# Patient Record
Sex: Female | Born: 1935 | Race: White | Hispanic: No | Marital: Married | Smoking: Never smoker
Health system: Southern US, Community
[De-identification: ages and names within clinical notes are randomized; demographics above are authoritative.]

## PROBLEM LIST (undated history)

## (undated) DIAGNOSIS — Z853 Personal history of malignant neoplasm of breast: Secondary | ICD-10-CM

## (undated) DIAGNOSIS — N189 Chronic kidney disease, unspecified: Secondary | ICD-10-CM

## (undated) DIAGNOSIS — C801 Malignant (primary) neoplasm, unspecified: Secondary | ICD-10-CM

## (undated) DIAGNOSIS — G459 Transient cerebral ischemic attack, unspecified: Secondary | ICD-10-CM

## (undated) DIAGNOSIS — I10 Essential (primary) hypertension: Secondary | ICD-10-CM

## (undated) DIAGNOSIS — R011 Cardiac murmur, unspecified: Secondary | ICD-10-CM

## (undated) DIAGNOSIS — E119 Type 2 diabetes mellitus without complications: Secondary | ICD-10-CM

## (undated) DIAGNOSIS — C541 Malignant neoplasm of endometrium: Secondary | ICD-10-CM

## (undated) HISTORY — DX: Transient cerebral ischemic attack, unspecified: G45.9

## (undated) HISTORY — DX: Malignant (primary) neoplasm, unspecified: C80.1

## (undated) HISTORY — DX: Essential (primary) hypertension: I10

## (undated) HISTORY — DX: Personal history of malignant neoplasm of breast: Z85.3

## (undated) HISTORY — DX: Chronic kidney disease, unspecified: N18.9

## (undated) HISTORY — DX: Cardiac murmur, unspecified: R01.1

## (undated) HISTORY — DX: Type 2 diabetes mellitus without complications: E11.9

## (undated) HISTORY — DX: Malignant neoplasm of endometrium: C54.1

---

## 1968-09-30 HISTORY — PX: ABDOMINAL HYSTERECTOMY: SHX81

## 1998-01-30 HISTORY — PX: SQUAMOUS CELL CARCINOMA EXCISION: SHX2433

## 1998-02-10 ENCOUNTER — Other Ambulatory Visit: Admission: RE | Admit: 1998-02-10 | Discharge: 1998-02-10 | Payer: Self-pay | Admitting: Obstetrics and Gynecology

## 1999-01-31 HISTORY — PX: LAPAROSCOPIC CHOLECYSTECTOMY: SUR755

## 1999-04-05 ENCOUNTER — Other Ambulatory Visit: Admission: RE | Admit: 1999-04-05 | Discharge: 1999-04-05 | Payer: Self-pay | Admitting: Obstetrics and Gynecology

## 2001-06-03 ENCOUNTER — Other Ambulatory Visit: Admission: RE | Admit: 2001-06-03 | Discharge: 2001-06-03 | Payer: Self-pay | Admitting: Obstetrics and Gynecology

## 2006-01-30 HISTORY — PX: CATARACT EXTRACTION: SUR2

## 2006-08-01 ENCOUNTER — Encounter (INDEPENDENT_AMBULATORY_CARE_PROVIDER_SITE_OTHER): Payer: Self-pay | Admitting: Diagnostic Radiology

## 2006-08-01 ENCOUNTER — Encounter: Admission: RE | Admit: 2006-08-01 | Discharge: 2006-08-01 | Payer: Self-pay | Admitting: Obstetrics and Gynecology

## 2006-08-10 ENCOUNTER — Encounter: Admission: RE | Admit: 2006-08-10 | Discharge: 2006-08-10 | Payer: Self-pay | Admitting: Surgery

## 2006-09-05 ENCOUNTER — Ambulatory Visit (HOSPITAL_COMMUNITY): Admission: RE | Admit: 2006-09-05 | Discharge: 2006-09-06 | Payer: Self-pay | Admitting: Surgery

## 2006-09-05 ENCOUNTER — Encounter (INDEPENDENT_AMBULATORY_CARE_PROVIDER_SITE_OTHER): Payer: Self-pay | Admitting: Surgery

## 2006-09-05 HISTORY — PX: MASTECTOMY: SHX3

## 2007-01-04 ENCOUNTER — Ambulatory Visit: Payer: Self-pay | Admitting: Oncology

## 2007-03-04 ENCOUNTER — Ambulatory Visit: Payer: Self-pay | Admitting: Oncology

## 2007-11-07 ENCOUNTER — Ambulatory Visit: Payer: Self-pay | Admitting: Oncology

## 2008-07-04 IMAGING — CR DG CHEST 2V
2 series · 2 of 2 positions shown · non-contrast
Comparison: None.

CLINICAL DATA: Left breast cancer, preadmission for OR.
 CHEST - 2 VIEW:

[view not recorded (1 of 2)]
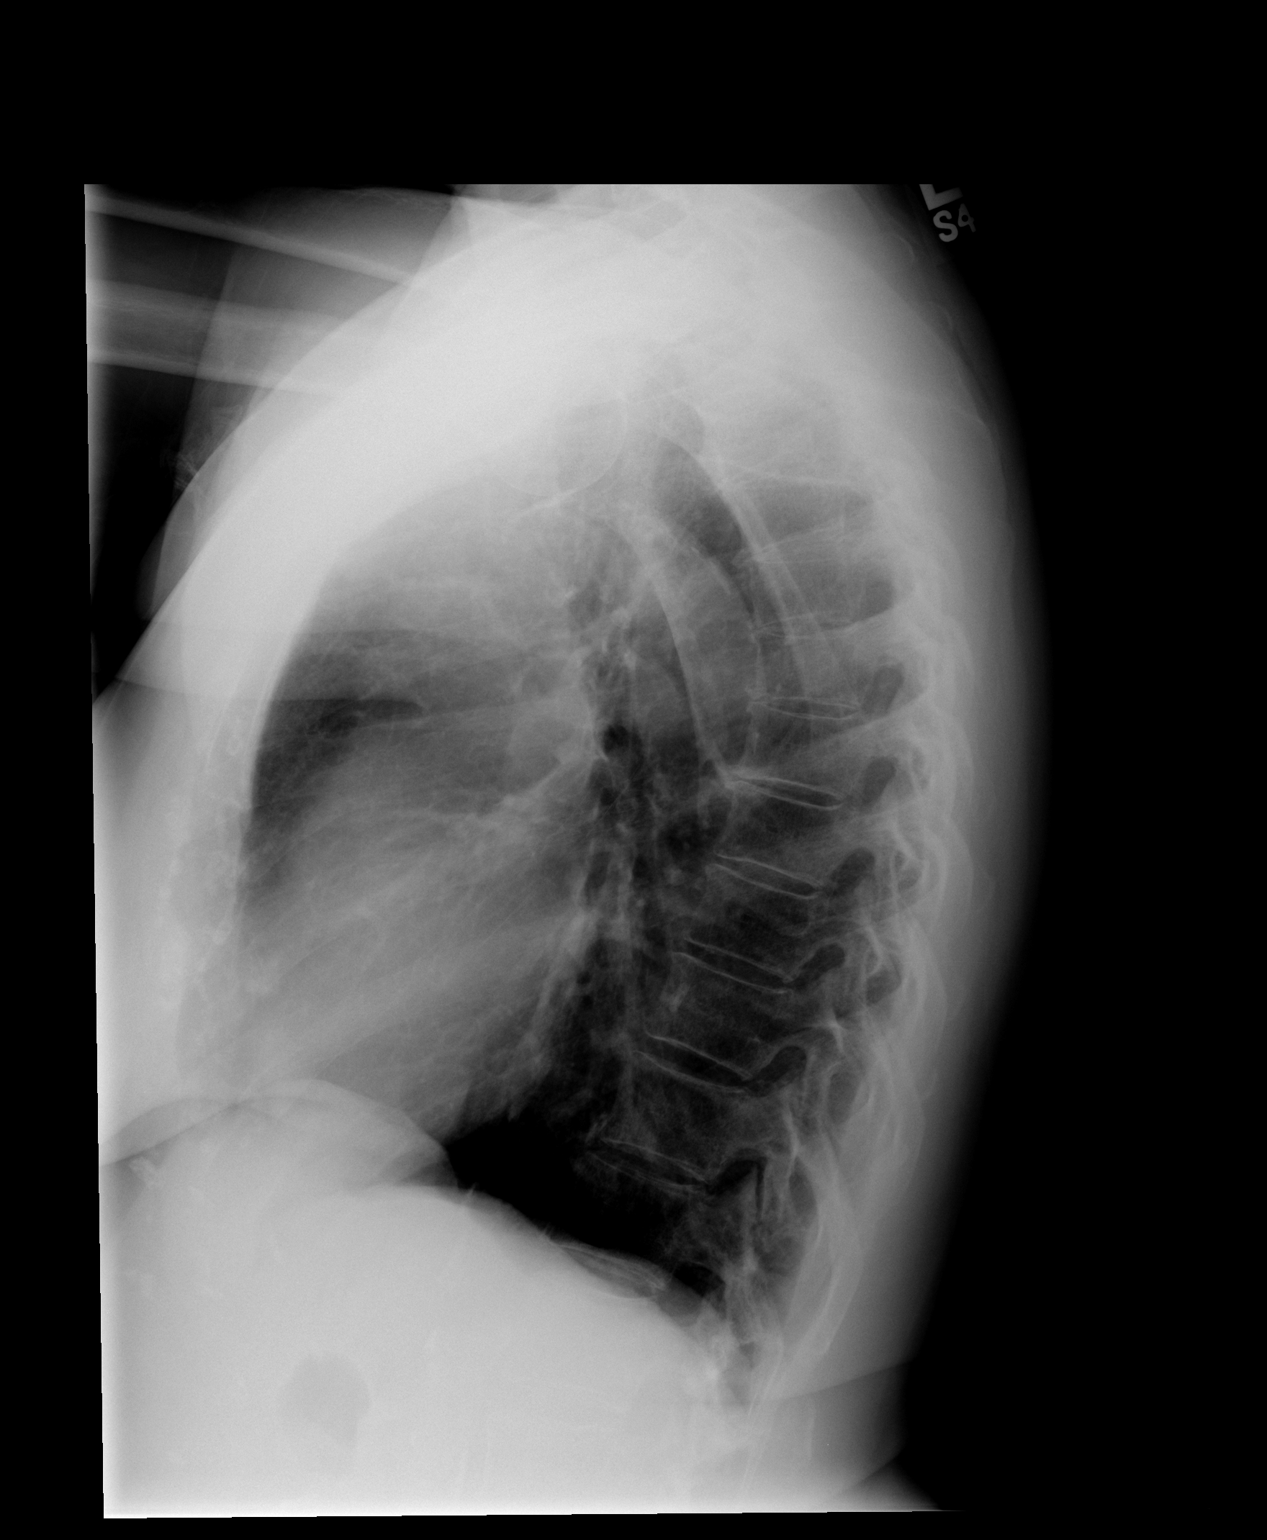

[view not recorded (2 of 2)]
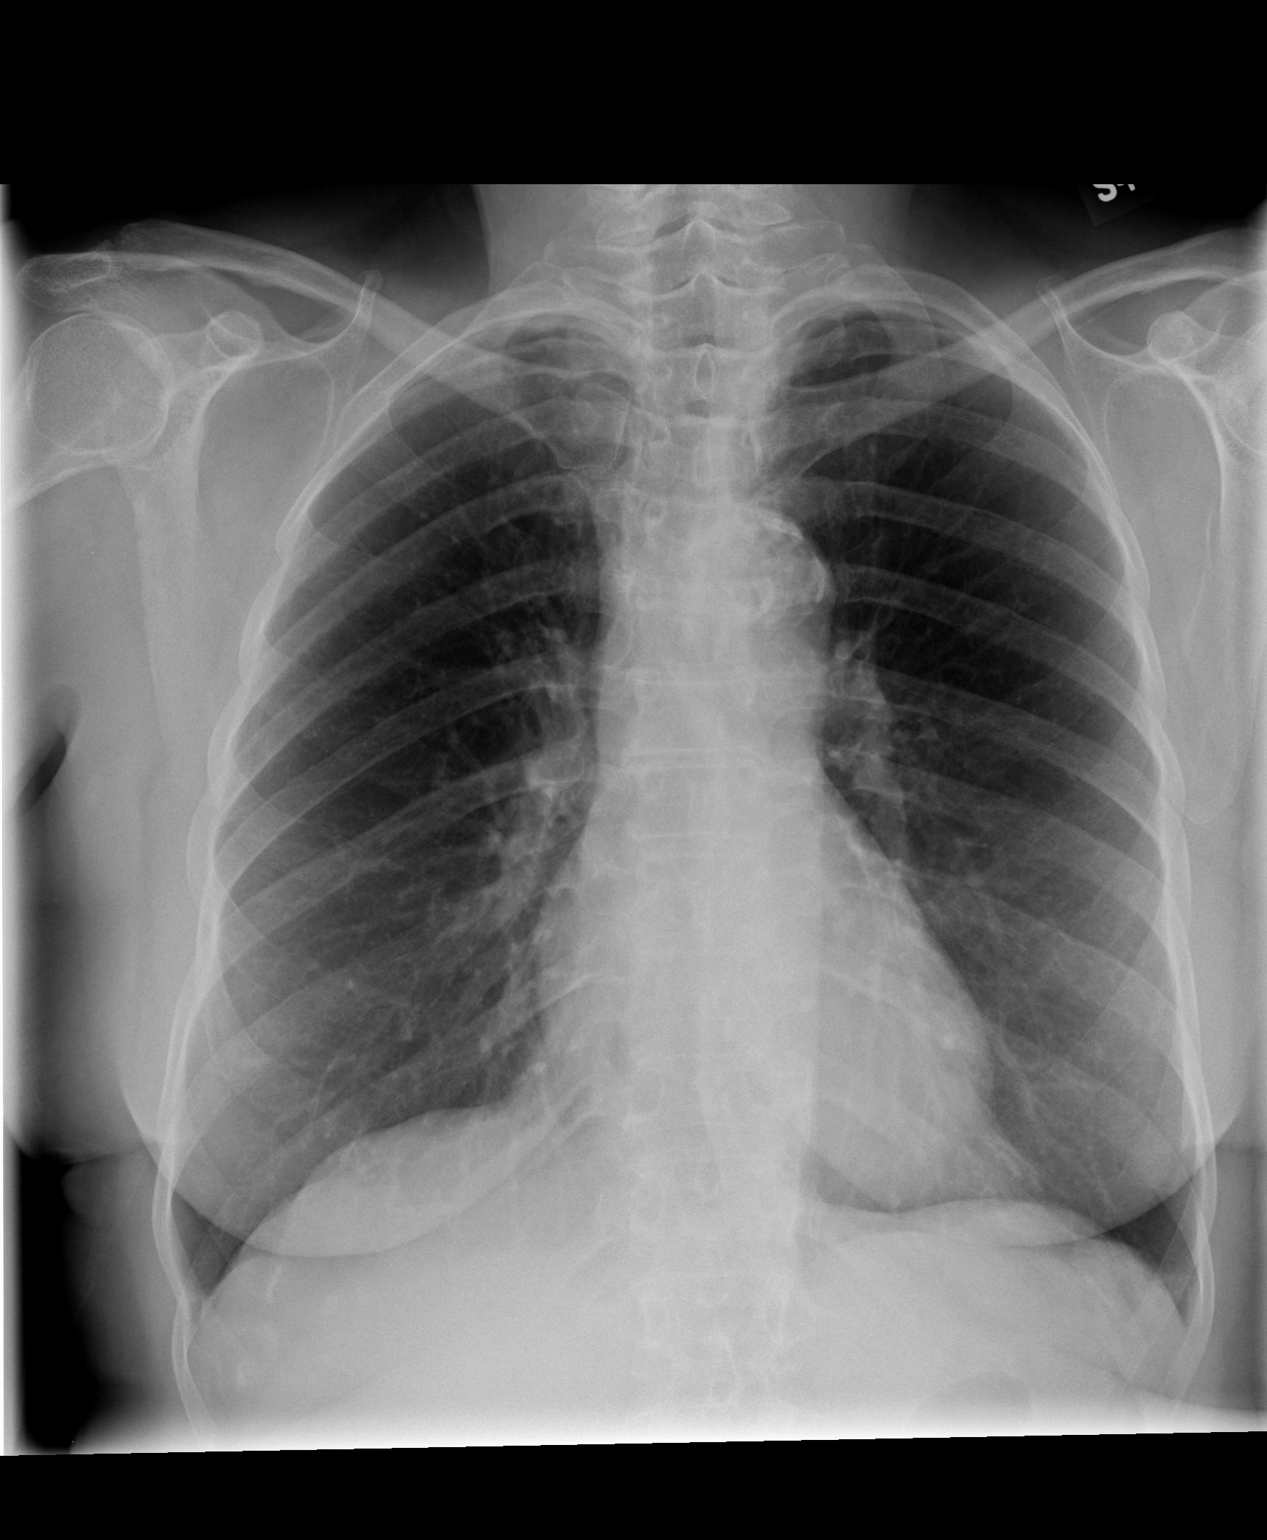

[2 of 2 positions shown; findings below may reference images not displayed]

FINDINGS: Trachea is midline.  Heart size normal.  Thoracic aorta is calcified. Lungs are clear. No pleural fluid.
IMPRESSION: No acute findings.

## 2008-11-06 ENCOUNTER — Ambulatory Visit: Payer: Self-pay | Admitting: Oncology

## 2009-10-07 ENCOUNTER — Encounter: Admission: RE | Admit: 2009-10-07 | Discharge: 2009-10-07 | Payer: Self-pay | Admitting: Obstetrics and Gynecology

## 2009-11-09 ENCOUNTER — Ambulatory Visit: Payer: Self-pay | Admitting: Oncology

## 2010-06-14 NOTE — Op Note (Signed)
[name], [name]                 ACCOUNT NO.:  [id number]   MEDICAL RECORD NO.:  [id number]          PATIENT TYPE:  AMB   LOCATION:  SDS                          FACILITY:  [facility]   PHYSICIAN:  Currie Paris, M.D.DATE OF BIRTH:  [birth year]   DATE OF PROCEDURE:  09/05/2006  DATE OF DISCHARGE:                               OPERATIVE REPORT   PREOPERATIVE DIAGNOSIS:  Ductal carcinoma in situ, extensive, left  breast upper outer quadrant.   POSTOPERATIVE DIAGNOSIS:  Ductal carcinoma in situ, extensive, left  breast upper outer quadrant.   OPERATION:  Left total mastectomy, with blue dye injection, and axillary  sentinel lymph node biopsy (two nodes removed).   SURGEON:  Currie Paris, M.D.   ANESTHESIA:  General.   CLINICAL HISTORY:  This is a 77 year old lady who has an extensive area  of DCIS diagnosis by core biopsy.  After discussion of alternatives, she  elected to proceed to a mastectomy.   DESCRIPTION OF PROCEDURE:  The patient was seen in the holding area, and  she had no further questions.  We identified and marked the right breast  as the operative side.  Prior to my seeing her, she had been injected  for her sentinel node with 1 mCi of filtered cell technetium 99,  filtered sulfur colloid.  This was in the left side and injected at  08:10.   The patient was taken to the operating room, and after satisfactory  general anesthesia had been obtained, the nipple-areolar area was  prepped and a time-out occurred.  I then injected 5 cc of dilute  methylene blue (2 cc of methylene blue and 3 cc of injectable saline)  into the subareolar space and massaged this in.  The breast was then  prepped and draped as a sterile field for mastectomy.  Using the  Neoprobe, I identified a hot area in the axilla and marked the overlying  skin.  I marked the inframammary fold and outlined an elliptical  incision, taking the skin up into the upper outer quadrant to be sure we  had plenty of superficial margin in the area of the DCIS.   I made the superior incision and raised skin flap medially to the  sternum, superiorly to the clavicle, and out into the axilla, freeing up  the axillary skin so I could access the axilla.  Using the Neoprobe, I  identified the hot area again and divided a little of the fatty tissue  and immediately found a blue lymphatic leading to an approximately 2 cm  blue lymph node.  This was excised with the cautery and had counts of  about 750.  Using the Neoprobe, I found a second hot area just a little  bit farther superior, and further dissection revealed a less than 1 cm  node that had counts of about 350 and no blue dye.  This was excised.  With this removed, there were just background counts of 5-15.  There  were no blue lymphatics or nodes noted, and no palpably abnormal nodes.  A small pack was placed while we  finished the case.   The inferior incision was made and the skin flap raised again medially  to the sternum and then inferiorly to the inframammary fold and  laterally out to the latissimus.  The breast was then removed from  medial to lateral, taking the fascia.  This was all done with cautery.  Bleeders were electrocoagulated.  As I got laterally, I detached the  breast tissue from the serratus and over to the anterior edge of the  latissimus, and then from the fatty tissue entering into the axilla, but  stayed out of the axilla.   At this point, I stopped and irrigated to make sure everything was dry.  Once I thought hemostasis was achieved, I placed two 19 Blake drains.  I  then waited a few moments for the pathology report to return, and both  nodes were reported as negative.   I then irrigated a final time.  I used some Vicryl to tack the flaps  down, and then closed the skin with staples.  The drains were charged  and appeared to work fine.   There were operative complications.  All counts were correct.   Estimated  blood loss was less 100 cc.  The patient tolerated the procedure well.      Currie Paris, M.D.  Electronically Signed     CJS/MEDQ  D:  09/05/2006  T:  09/05/2006  Job:  [id number]   cc:   Denver Faster, M.D.

## 2010-11-14 LAB — URINALYSIS, ROUTINE W REFLEX MICROSCOPIC
Glucose, UA: NEGATIVE
Specific Gravity, Urine: 1.011
pH: 8

## 2010-11-14 LAB — COMPREHENSIVE METABOLIC PANEL
AST: 26
CO2: 30
Calcium: 10.7 — ABNORMAL HIGH
Creatinine, Ser: 1.2
GFR calc Af Amer: 52 — ABNORMAL LOW
GFR calc non Af Amer: 43 — ABNORMAL LOW
Glucose, Bld: 99

## 2010-11-14 LAB — URINE MICROSCOPIC-ADD ON

## 2010-11-14 LAB — CBC
MCHC: 33.9
MCV: 91.5
RBC: 3.93

## 2010-11-14 LAB — DIFFERENTIAL
Lymphocytes Relative: 27
Lymphs Abs: 2.2
Neutro Abs: 4.8
Neutrophils Relative %: 59

## 2010-11-15 ENCOUNTER — Encounter (HOSPITAL_BASED_OUTPATIENT_CLINIC_OR_DEPARTMENT_OTHER): Payer: Medicare Other | Admitting: Oncology

## 2010-11-15 DIAGNOSIS — D0590 Unspecified type of carcinoma in situ of unspecified breast: Secondary | ICD-10-CM

## 2010-11-15 DIAGNOSIS — Z170 Estrogen receptor positive status [ER+]: Secondary | ICD-10-CM

## 2010-12-28 ENCOUNTER — Other Ambulatory Visit: Payer: Self-pay | Admitting: Obstetrics and Gynecology

## 2011-02-09 ENCOUNTER — Encounter (INDEPENDENT_AMBULATORY_CARE_PROVIDER_SITE_OTHER): Payer: Self-pay | Admitting: Obstetrics and Gynecology

## 2011-04-18 DIAGNOSIS — H5200 Hypermetropia, unspecified eye: Secondary | ICD-10-CM | POA: Insufficient documentation

## 2011-04-18 DIAGNOSIS — H02839 Dermatochalasis of unspecified eye, unspecified eyelid: Secondary | ICD-10-CM | POA: Insufficient documentation

## 2011-04-18 DIAGNOSIS — H26499 Other secondary cataract, unspecified eye: Secondary | ICD-10-CM | POA: Insufficient documentation

## 2011-04-18 DIAGNOSIS — H5030 Unspecified intermittent heterotropia: Secondary | ICD-10-CM | POA: Insufficient documentation

## 2011-06-20 ENCOUNTER — Encounter (INDEPENDENT_AMBULATORY_CARE_PROVIDER_SITE_OTHER): Payer: Self-pay | Admitting: General Surgery

## 2011-06-20 ENCOUNTER — Ambulatory Visit (INDEPENDENT_AMBULATORY_CARE_PROVIDER_SITE_OTHER): Payer: Medicare Other | Admitting: Surgery

## 2011-06-20 ENCOUNTER — Encounter (INDEPENDENT_AMBULATORY_CARE_PROVIDER_SITE_OTHER): Payer: Self-pay | Admitting: Surgery

## 2011-06-20 VITALS — BP 138/70 | HR 76 | Temp 98.0°F | Resp 18 | Ht 63.0 in | Wt 153.0 lb

## 2011-06-20 DIAGNOSIS — Z853 Personal history of malignant neoplasm of breast: Secondary | ICD-10-CM

## 2011-06-20 NOTE — Patient Instructions (Signed)
We will see you again on an as needed basis. Please call the office at [phone] if you have any questions or concerns. Thank you for allowing us to take care of you.  

## 2011-06-20 NOTE — Progress Notes (Signed)
NAME: [name]       DOB: 1924-05-06           DATE: 06/20/2011       MRN: [id number]   [name] is a 78 y.o.Marland Kitchenfemale who presents for routine followup of her Left breast DCIS diagnosed in 2008 and treated with mastectomy. She has no problems or concerns on either side.  PFSH: She has had no significant changes since the last visit here.  ROS: There have been no significant changes since the last visit here  EXAM: General: The patient is alert, oriented, generally healty appearing, NAD. Mood and affect are normal.  Breasts:  Right breast is normal,and the left is s/p mastectomy with no evidence of recurrence.  Lymphatics: She has no axillary or supraclavicular adenopathy on either side.  Extremities: Full ROM of the surgical side with no lymphedema noted.  Data Reviewed: Mammogram in NOvember was negative  Impression: Doing well, with no evidence of recurrent cancer or new cancer  Plan: She is five years out without recurrence, so will see prn

## 2011-09-29 ENCOUNTER — Telehealth: Payer: Self-pay | Admitting: Oncology

## 2011-09-29 NOTE — Telephone Encounter (Signed)
S/w the pt and she is aware of her appt in oct to see dr [name]

## 2011-11-22 ENCOUNTER — Ambulatory Visit: Payer: Medicare Other | Admitting: Oncology

## 2011-11-24 ENCOUNTER — Other Ambulatory Visit: Payer: Self-pay | Admitting: *Deleted

## 2011-11-24 DIAGNOSIS — C50419 Malignant neoplasm of upper-outer quadrant of unspecified female breast: Secondary | ICD-10-CM

## 2011-11-24 MED ORDER — OMEPRAZOLE 40 MG PO CPDR: 40.0000 mg | DELAYED_RELEASE_CAPSULE | Freq: Every day | ORAL | Status: DC

## 2011-12-19 ENCOUNTER — Ambulatory Visit (HOSPITAL_BASED_OUTPATIENT_CLINIC_OR_DEPARTMENT_OTHER): Payer: Medicare Other | Admitting: Oncology

## 2011-12-19 VITALS — BP 129/63 | HR 73 | Temp 98.0°F | Resp 20 | Ht 63.0 in | Wt 154.1 lb

## 2011-12-19 DIAGNOSIS — Z853 Personal history of malignant neoplasm of breast: Secondary | ICD-10-CM

## 2011-12-19 NOTE — Progress Notes (Signed)
ID: [name]   DOB: 1924-12-11  MR#: [id number]  WUJ#:[id number]  PCP: Dan Maker, MD GYN: Carrington Clamp MD SU: Carvel Getting MD:   HISTORY OF PRESENT ILLNESS: [name] had screening mammogram Jun 16, 2006 through [facility], which showed some left breast calcifications.  [facility] and after review of the original mammogram and magnification views obtained on June 10th; she had a stereotactic core needle biopsy performed July 2nd, 2008.  This showed ([id number] and [phone]) a high-grade ductal carcinoma in situ that was ER positive at 26%, PR barely positive at 1%.    With this information, the patient was referred to Dr. Jamey Ripa and bilateral breast MRI was obtained July 11th.  This did show an area of abnormal clumped nodular enhancements in the left upper outer quadrant measuring up to 8 cm.  There were post-biopsy changes as well, but no other abnormal areas in the right breast, left breast, or axillae.  Accordingly, after appropriate discussion of options, the patient proceeded to left total mastectomy and axillary lymph node biopsy, September 05, 2006, under [facility].  The final pathology [id number]) showed high-grade ductal carcinoma in situ measuring at least 1.3 cm with negative margins at 2 mm (deep) and 2 negative sentinel lymph nodes. Her subsequent history is as detailed below.  INTERVAL HISTORY: The patient returns today for continuing followup of her noninvasive breast cancer. The interval history is significant for her husbands continuing deterioration. [name] is now 39, and has multiple medical problems which make it difficult for him to stay alone at home, although "mentally he's very sharp" still.. She is the primary caregiver, and gladly so, as she tells me, but she is beginning to feel that perhaps it would be helpful to have someone come by the house once or twice a week so she would have some time to do  errands and perhaps a little time for herself as well.  REVIEW OF SYSTEMS: She describes herself is severely fatigued. She has pain in her back and shoulders intermittently. She does feel short of breath when walking up stairs but not otherwise and she keeps a dry cough this time of year. Otherwise a detailed review of systems today was noncontributory.   PAST MEDICAL HISTORY: Past Medical History  Diagnosis Date  . Glaucoma   . History of breast cancer   . Hypertension   . Heart murmur   1. History of endometrial cancer, status post hysterectomy with bilateral salpingo-oophorectomy at age 40.  The patient apparently had some radiation to the back or pelvis performed in [place], perhaps a few years after that surgery.  It is hard for me to know why she would have had that done.  I do not have those records for review.   2. Status post cholecystectomy. 3. Status post removal of benign tumor from the right breast.   4. History of cataract surgery. 5. History of a benign growth removed from the left knee. 6. History of glaucoma. 7. Peripheral vascular disease with bilateral carotid bruits, being following closely, according to the patient, status post 3 angiograms, which have shown no progression. 8. History of hypercholesterolemia. 9. History of basal cell carcinoma status post Moh's surgery under [facility]; also history of squamous cell carcinoma, both apparently arising from the right nose area.   10. History of fall on the ice with fracture of the pelvis, which healed without other intervention, approximately 5 years ago.  History of osteopenia.    PAST SURGICAL HISTORY: Past Surgical History  Procedure Date  . Mastectomy 09/05/2006    left  . Abdominal hysterectomy 1970's  . Laparoscopic cholecystectomy 2001  . Squamous cell carcinoma excision 2000    nose  . Cataract extraction 2008    FAMILY HISTORY Family History  Problem Relation Age of Onset  . Stroke Father   .  Heart disease Mother   . Heart disease Sister   . Diabetes Sister   The patient's father died from a stroke at the age of 58.  The patient's mother died in her sleep at the age of 77.  The patient has one sister with multiple medical problems but no history of cancer.    GYNECOLOGIC HISTORY: She is GX P2, first pregnancy to term at age 9.  After hysterectomy, she took Ogen for more than 35 years, stopping  with this diagnosis.    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  Their first daughter, [name], died at the age of 105 from metastatic melanoma.  The second daughter, [name], M.D.)  is a [place]  works in the Neonatal Unit at [employer] [facility] [employer].  Amber Ohm has a daughter currently a Sr in [place]  The patient attends a [employer] in [place].   ADVANCED DIRECTIVES: in place  HEALTH MAINTENANCE: History  Substance Use Topics  . Smoking status: Never Smoker   . Smokeless tobacco: Not on file  . Alcohol Use: No     Colonoscopy:  PAP:  Bone density:  Lipid panel:  Allergies  Allergen Reactions  . Darvon (Propoxyphene Hcl) Anaphylaxis  . Codeine Nausea And Vomiting    Current Outpatient Prescriptions  Medication Sig Dispense Refill  . aspirin 81 MG tablet Take 81 mg by mouth daily.      . Calcium Carbonate-Vitamin D (CALCIUM + D PO) Take by mouth.      . Clopidogrel Bisulfate (PLAVIX PO) Take by mouth every other day.      Marland Kitchen LABETALOL HCL PO Take by mouth daily.      Marland Kitchen lisinopril-hydrochlorothiazide (PRINZIDE,ZESTORETIC) 10-12.5 MG per tablet daily.      . Lovastatin (MEVACOR PO) Take by mouth daily at 12 noon.      . Multiple Vitamin (MULTIVITAMIN) capsule Take 1 capsule by mouth daily.      Marland Kitchen omeprazole (PRILOSEC) 40 MG capsule Take 1 capsule (40 mg total) by mouth daily.  30 capsule  6  . Probiotic Product  (PROBIOTIC FORMULA PO) Take by mouth.      . travoprost, benzalkonium, (TRAVATAN) 0.004 % ophthalmic solution 1 drop at bedtime.        OBJECTIVE: Elderly white woman who appears well Filed Vitals:   12/19/11 1436  BP: 129/63  Pulse: 73  Temp: 98 F (36.7 C)  Resp: 20     Body mass index is 27.30 kg/(m^2).    ECOG FS: 1  Sclerae unicteric Oropharynx clear No cervical or supraclavicular adenopathy Lungs no rales or rhonchi Heart regular rate and rhythm Abd benign MSK no focal spinal tenderness, no peripheral edema Neuro: nonfocal Breasts: Right breast no suspicious findings. Left breast status post mastectomy. No evidence of local recurrence. The left axilla is benign.   LAB RESULTS: Lab Results  Component Value Date   WBC 8.2 09/03/2006   NEUTROABS  4.8 09/03/2006   HGB 12.2 09/03/2006   HCT 36.0 09/03/2006   MCV 91.5 09/03/2006   PLT 347 09/03/2006      Chemistry      Component Value Date/Time   NA 135 09/03/2006 1110   K 3.8 09/03/2006 1110   CL 94* 09/03/2006 1110   CO2 30 09/03/2006 1110   BUN 19 09/03/2006 1110   CREATININE 1.20 09/03/2006 1110      Component Value Date/Time   CALCIUM 10.7* 09/03/2006 1110   ALKPHOS 57 09/03/2006 1110   AST 26 09/03/2006 1110   ALT 12 09/03/2006 1110   BILITOT 0.7 09/03/2006 1110       No results found for this basename: LABCA2    No components found with this basename: LABCA125    No results found for this basename: INR:1;PROTIME:1 in the last 168 hours  Urinalysis    Component Value Date/Time   COLORURINE YELLOW 09/03/2006 1110   APPEARANCEUR CLEAR 09/03/2006 1110   LABSPEC 1.011 09/03/2006 1110   PHURINE 8.0 09/03/2006 1110   GLUCOSEU NEGATIVE 09/03/2006 1110   HGBUR NEGATIVE 09/03/2006 1110   BILIRUBINUR NEGATIVE 09/03/2006 1110   KETONESUR NEGATIVE 09/03/2006 1110   PROTEINUR NEGATIVE 09/03/2006 1110   UROBILINOGEN 0.2 09/03/2006 1110   NITRITE NEGATIVE 09/03/2006 1110   LEUKOCYTESUR TRACE* 09/03/2006 1110    STUDIES: No results found.  ASSESSMENT:  78 y.o. [place] woman status post left mastectomy and sentinel lymph node sampling August of 2008 for a 1.3-cm area of high-grade ductal carcinoma in situ, with negative margins and 0/2 sentinel lymph nodes involved.  She has been followed by observation alone.   PLAN: Maddix is now 5 years out from her original surgery, and I am comfortable releasing her back to her primary care physician. She knows we will be glad to see her at any point as the need arises, but no further routine appointments have been made for her here. As far as breast cancer followup is concerned, all she needs is yearly mammography and yearly physician breast exam.   Jiovanny Burdell C    12/19/2011

## 2012-07-30 ENCOUNTER — Other Ambulatory Visit: Payer: Self-pay | Admitting: *Deleted

## 2012-07-30 DIAGNOSIS — C50412 Malignant neoplasm of upper-outer quadrant of left female breast: Secondary | ICD-10-CM

## 2012-07-30 MED ORDER — OMEPRAZOLE 40 MG PO CPDR: 40.0000 mg | DELAYED_RELEASE_CAPSULE | Freq: Every day | ORAL | Status: DC

## 2015-03-22 ENCOUNTER — Telehealth: Payer: Self-pay | Admitting: Neurology

## 2015-03-22 NOTE — Telephone Encounter (Signed)
[name] /daughter called [id number] [facility] who has been a patient of Dr. Rexene Alberts for 20 years. [name] is having tia's and [name] called Saturday and LM for Dr. On call to return her call and she did not get a call back. She would like to speak to Dr. Rexene Alberts nurse. I spoke to [facility] this morning and  scheduled an appointment for [facility] for 04/08/15 with Dr Felecia Shelling and let her know she would have to sign medical release form and have her records sent over. Please call [name]. Thanks dg

## 2015-03-22 NOTE — Telephone Encounter (Signed)
PC with [name]--she sts. her mother is a former pt. of Dr. Garth Bigness from [facility].  Hx. of carotid artery stenosis, tia, on Plavix every other day.  Recently having episodes of dysphagia, right sided weakness. I have advised pt. see someone sooner to r/o cva, but [name] is hesitant, stating dysphagia is intermittent. Has seen another neurologist at [facility] but was not happy with that physician--requesting urgernt appt.  Appt. given 03-24-15 at 1320.  She will bring records with her.  She also has an appt. on 04-08-15, iin case she can't make the 2-22 appt.  She will cancel the one she doesn't need./fim

## 2015-03-24 ENCOUNTER — Encounter: Payer: Self-pay | Admitting: Neurology

## 2015-03-24 ENCOUNTER — Ambulatory Visit (INDEPENDENT_AMBULATORY_CARE_PROVIDER_SITE_OTHER): Payer: Medicare Other | Admitting: Neurology

## 2015-03-24 VITALS — BP 130/58 | HR 68 | Resp 16 | Ht 62.5 in | Wt 142.8 lb

## 2015-03-24 DIAGNOSIS — I10 Essential (primary) hypertension: Secondary | ICD-10-CM

## 2015-03-24 DIAGNOSIS — E785 Hyperlipidemia, unspecified: Secondary | ICD-10-CM | POA: Diagnosis not present

## 2015-03-24 DIAGNOSIS — G459 Transient cerebral ischemic attack, unspecified: Secondary | ICD-10-CM | POA: Insufficient documentation

## 2015-03-24 DIAGNOSIS — R7989 Other specified abnormal findings of blood chemistry: Secondary | ICD-10-CM | POA: Insufficient documentation

## 2015-03-24 DIAGNOSIS — I6523 Occlusion and stenosis of bilateral carotid arteries: Secondary | ICD-10-CM

## 2015-03-24 DIAGNOSIS — R748 Abnormal levels of other serum enzymes: Secondary | ICD-10-CM

## 2015-03-24 DIAGNOSIS — R749 Abnormal serum enzyme level, unspecified: Secondary | ICD-10-CM

## 2015-03-24 DIAGNOSIS — I6529 Occlusion and stenosis of unspecified carotid artery: Secondary | ICD-10-CM | POA: Insufficient documentation

## 2015-03-24 MED ORDER — CLOPIDOGREL BISULFATE 75 MG PO TABS: ORAL_TABLET | ORAL | Status: DC

## 2015-03-24 NOTE — Progress Notes (Signed)
[facility]  PATIENT: [name] DOB: August 07, 1924  REFERRING DOCTOR OR PCP:  Dr. Rita Ohara SOURCE: Patient and records  _________________________________   HISTORICAL  CHIEF COMPLAINT:  Chief Complaint  Patient presents with  . Transient Ischemic Attack    Former pt. of Dr. Garth Bigness from [facility], here for f/u of carotid artery stenosis, hx. of tia.  Thinks last carotid duplex was about 3 yrs. ago.  Sts. last week she went into a restaurant to order a pizza, suddenly could not get the words out--knew what she wanted to say, but couldn't say them.  Episode lasted a min. or two.  Dtr. sts. her speech is still different, sounds to her like she has cotton in her mouth.  Pt. sts. she also had brief weakness right hand last week while pouring coffee  . Speech Disturbance    She denies any missed doses of Plavix/fim    HISTORY OF PRESENT ILLNESS:  [name] is an 82 year old woman with a history of TIA (I have seen at [facility] in the past) and carotid arterial stenosis who had an episode of aphasia last week lasting 2-3 minutes.   She was ordering a pizza and had trouble getting the words.   She got out sounds and grunts but not formed words.    A couple minutes later, her speech had cleared and she signed the check.  During the episode, she also had mild reduced use of her right arm.     The next day, she felt weak in her right arm and had trouble pouring from a pot.   Since the episode, she has had more anxiety.   Her husband is ill and she feels more stress.   Sh notes a fine tremor in her hands has intensified.     She was on Plavix 75 mg qod and ASA 81 mg daily but went back to both med's daily after the event.   She has bruising on daily med's.      Her daughter saw her yesterday and noted that speech content is normal but her voice is slightly less clear.    Cognitively she is intact.    She has no difficulty with gait.    Strength and  sensation are fine.  He past, Carotid dopp  In 2007, she had a TIA, also with reduced speech.    The TIA occurred while driving and she felt strange and pulled over.  Symptoms lasted 3-5 minutes.    Carotid doppler showed 40-59% stenosis bilaterally.     In 2015, she had a repeat Carotid doppler showing 40-59% stenosis bilaterally.     She has several CVA risk factors including hypercholesterolemia, HTN and pre-diabetes.   Also, she has the bilateral carotid stenosis.     REVIEW OF SYSTEMS: Constitutional: No fevers, chills, sweats, or change in appetite Eyes: No visual changes, double vision, eye pain Ear, nose and throat: No hearing loss, ear pain, nasal congestion, sore throat Cardiovascular: No chest pain, palpitations Respiratory: No shortness of breath at rest or with exertion.   No wheezes GastrointestinaI: No nausea, vomiting, diarrhea, abdominal pain, fecal incontinence Genitourinary: She has mild frequency and some nocturia. Musculoskeletal: Some neck pain > back pain Integumentary: No rash, pruritus, skin lesions Neurological: as above Psychiatric: No depression at this time.  No anxiety Endocrine: No palpitations, diaphoresis, change in appetite, change in weigh or increased thirst Hematologic/Lymphatic: No anemia, purpura, petechiae. Allergic/Immunologic: No itchy/runny eyes, nasal congestion,  recent allergic reactions, rashes  ALLERGIES: Allergies  Allergen Reactions  . Darvon [Propoxyphene Hcl] Anaphylaxis  . Codeine Nausea And Vomiting    HOME MEDICATIONS:  Current outpatient prescriptions:  .  aspirin 81 MG tablet, Take 81 mg by mouth daily., Disp: , Rfl:  .  Calcium Carbonate-Vitamin D (CALCIUM + D PO), Take by mouth., Disp: , Rfl:  .  Clopidogrel Bisulfate (PLAVIX PO), Take by mouth every other day., Disp: , Rfl:  .  LABETALOL HCL PO, Take by mouth daily., Disp: , Rfl:  .  lisinopril-hydrochlorothiazide (PRINZIDE,ZESTORETIC) 10-12.5 MG per tablet, daily.,  Disp: , Rfl:  .  Lovastatin (MEVACOR PO), Take by mouth daily at 12 noon., Disp: , Rfl:  .  Multiple Vitamin (MULTIVITAMIN) capsule, Take 1 capsule by mouth daily., Disp: , Rfl:  .  omeprazole (PRILOSEC) 40 MG capsule, Take 1 capsule (40 mg total) by mouth daily., Disp: 30 capsule, Rfl: 0 .  Probiotic Product (PROBIOTIC FORMULA PO), Take by mouth., Disp: , Rfl:  .  travoprost, benzalkonium, (TRAVATAN) 0.004 % ophthalmic solution, 1 drop at bedtime., Disp: , Rfl:   PAST MEDICAL HISTORY: Past Medical History  Diagnosis Date  . Glaucoma   . History of breast cancer   . Hypertension   . Heart murmur   . Cancer ([place])   . Diabetes mellitus without complication ([place])   . Chronic kidney disease   . TIA (transient ischemic attack)     PAST SURGICAL HISTORY: Past Surgical History  Procedure Laterality Date  . Mastectomy  09/05/2006    left  . Abdominal hysterectomy  1970's  . Laparoscopic cholecystectomy  2001  . Squamous cell carcinoma excision  2000    nose  . Cataract extraction  2008    FAMILY HISTORY: Family History  Problem Relation Age of Onset  . Stroke Father   . Heart disease Mother   . Heart disease Sister   . Diabetes Sister     SOCIAL HISTORY:  Social History   Social History  . Marital Status: Married    Spouse Name: N/A  . Number of Children: N/A  . Years of Education: N/A   Occupational History  . Not on file.   Social History Main Topics  . Smoking status: Never Smoker   . Smokeless tobacco: Not on file  . Alcohol Use: No  . Drug Use: No  . Sexual Activity: Not on file   Other Topics Concern  . Not on file   Social History Narrative     PHYSICAL EXAM  Filed Vitals:   03/24/15 1325  BP: 130/58  Pulse: 68  Resp: 16  Height: 5' 2.5" (1.588 m)  Weight: 142 lb 12.8 oz (64.774 kg)    Body mass index is 25.69 kg/(m^2).   General: The patient is well-developed and well-nourished and in no acute distress  Eyes:  Funduscopic exam shows  normal optic discs and retinal vessels.  Neck: The neck is supple, no carotid bruits are noted.  The neck is nontender.  Cardiovascular: The heart has a regular rate and rhythm with a normal S1 and S2. There were no murmurs, gallops or rubs. Lungs are clear to auscultation.  Skin: Extremities are without significant edema.  Musculoskeletal:  Back is nontender  Neurologic Exam  Mental status: The patient is alert and oriented x 3 at the time of the examination. The patient has apparent normal recent and remote memory, with an apparently normal attention span and concentration ability.   Speech  is normal.  Cranial nerves: Extraocular movements are full. Pupils are equal, round, and reactive to light and accomodation.  Visual fields are full.  Facial symmetry is present. There is good facial sensation to soft touch bilaterally.Facial strength is normal.  Trapezius and sternocleidomastoid strength is normal. No dysarthria is noted.  The tongue is midline, and the patient has symmetric elevation of the soft palate. No obvious hearing deficits are noted.  Motor:  Muscle bulk is normal.   Tone is normal. Strength is  5 / 5 in all 4 extremities.   Sensory: Sensory testing is intact to pinprick, soft touch and vibration sensation in all 4 extremities.  Coordination: Cerebellar testing reveals good finger-nose-finger and heel-to-shin bilaterally.  Gait and station: Station is normal.   Gait is normal. Tandem gait is normal. Romberg is negative.   Reflexes: Deep tendon reflexes are brisk at the knees with spreadl bilaterally.   Plantar responses are flexor.    DIAGNOSTIC DATA (LABS, IMAGING, TESTING) - I reviewed patient records, labs, notes, testing and imaging myself where available.  Lab Results  Component Value Date   WBC 8.2 09/03/2006   HGB 12.2 09/03/2006   HCT 36.0 09/03/2006   MCV 91.5 09/03/2006   PLT 347 09/03/2006      Component Value Date/Time   NA 135 09/03/2006 1110   K  3.8 09/03/2006 1110   CL 94* 09/03/2006 1110   CO2 30 09/03/2006 1110   GLUCOSE 99 09/03/2006 1110   BUN 19 09/03/2006 1110   CREATININE 1.20 09/03/2006 1110   CALCIUM 10.7* 09/03/2006 1110   PROT 7.4 09/03/2006 1110   ALBUMIN 4.3 09/03/2006 1110   AST 26 09/03/2006 1110   ALT 12 09/03/2006 1110   ALKPHOS 57 09/03/2006 1110   BILITOT 0.7 09/03/2006 1110   GFRNONAA 43* 09/03/2006 1110   GFRAA * 09/03/2006 1110    52        The eGFR has been calculated using the MDRD equation. This calculation has not been validated in all clinical       ASSESSMENT AND PLAN  Transient cerebral ischemia, unspecified transient cerebral ischemia type - Plan: MR Brain Wo Contrast, MR MRA HEAD WO CONTRAST, US Carotid Bilateral  Carotid stenosis, bilateral - Plan: MR Brain Wo Contrast, MR MRA HEAD WO CONTRAST, US Carotid Bilateral  Essential hypertension, benign  Hyperlipidemia   In summary, [name] is an 82 year old woman with a history of a transient ischemic attack in 2007 who appears to have had another TIA last week.    Currently, her daughter (an MD) notes that her speech is not back to her pre-TIA baseline.    She has several risk factors for stroke including elevated cholesterol, hypertension and prediabetes.   She will continue on her medications to reduce those risks. Additionally, she will continue on aspirin and Plavix daily.  This TIA occurred while on aspirin and every other day Plavix, we need to make sure that she has not had progressive worsening of her carotid arterial stenosis. A carotid Doppler study and MR angiogram will be performed. We'll check an MRI of the brain as she has had some persistent symptoms to determine if she has had an actual stroke.  She will return to see me in 2-3 months for a regular visit but call sooner if she has new or worsening neurologic symptoms.  45 minute face-to-face evaluation with greater than one half of the time counseling and coordinating  care  [name] A. Felecia Shelling, MD,  PhD 04/20/2246, 2:50 PM Certified in Neurology, Clinical Neurophysiology, Sleep Medicine, Pain Medicine and Neuroimaging  [facility] [address] [place], [place] [address] [phone]

## 2015-03-25 LAB — HOMOCYSTEINE: HOMOCYSTEINE: 21.2 umol/L — AB (ref 0.0–15.0)

## 2015-03-25 LAB — SEDIMENTATION RATE: Sed Rate: 21 mm/hr (ref 0–40)

## 2015-03-25 NOTE — Progress Notes (Signed)
TODAY'S OV NOTE FAXED TO DR. Lajoyce Corners FAX # [phone]

## 2015-03-29 ENCOUNTER — Telehealth: Payer: Self-pay | Admitting: *Deleted

## 2015-03-29 MED ORDER — FA-PYRIDOXINE-CYANOCOBALAMIN 2.5-25-2 MG PO TABS: 1.0000 | ORAL_TABLET | Freq: Every day | ORAL | Status: DC

## 2015-03-29 NOTE — Telephone Encounter (Signed)
-----   Message from Britt Bottom, MD sent at 03/26/2015  3:54 PM EST ----- Homocysteine is elevated I would like her to start taking Foltx 1 by mouth daily.   #30  #11

## 2015-03-29 NOTE — Telephone Encounter (Signed)
PC with [name] and per RAS, advised that homocysteine level is elevated; RAS would like her to take Foltx, one daily.  She is agreeable.  Rx. escribed to [facility] per her request/fim

## 2015-04-05 ENCOUNTER — Other Ambulatory Visit: Payer: Medicare Other

## 2015-04-08 ENCOUNTER — Ambulatory Visit: Payer: Medicare Other | Admitting: Neurology

## 2015-04-13 ENCOUNTER — Ambulatory Visit
Admission: RE | Admit: 2015-04-13 | Discharge: 2015-04-13 | Disposition: A | Discharge status: Home / Self Care | Payer: Medicare Other | Source: Ambulatory Visit | Attending: Neurology | Admitting: Neurology

## 2015-04-13 DIAGNOSIS — I6523 Occlusion and stenosis of bilateral carotid arteries: Secondary | ICD-10-CM | POA: Diagnosis not present

## 2015-04-13 DIAGNOSIS — G459 Transient cerebral ischemic attack, unspecified: Secondary | ICD-10-CM

## 2015-04-14 ENCOUNTER — Telehealth: Payer: Self-pay | Admitting: Neurology

## 2015-04-14 NOTE — Telephone Encounter (Signed)
I spoke to [name] and let her know the results of the 3 studies.  The carotid Doppler shows 50-59% stenosis on the left and lesser stenosis on the right  The MRI of the brain showed some chronic microvascular ischemic change and mild atrophy, probably within typical  limits for age  The MRA showed left middle cerebral artery stenosis and that could be hemodynamically significant and could be related to her TIA.  She is advised to continue Plavix and aspirin both on a daily basis.

## 2015-05-19 DIAGNOSIS — E7439 Other disorders of intestinal carbohydrate absorption: Secondary | ICD-10-CM | POA: Insufficient documentation

## 2015-05-19 DIAGNOSIS — K219 Gastro-esophageal reflux disease without esophagitis: Secondary | ICD-10-CM | POA: Insufficient documentation

## 2015-05-19 DIAGNOSIS — D649 Anemia, unspecified: Secondary | ICD-10-CM | POA: Insufficient documentation

## 2015-05-19 DIAGNOSIS — N183 Chronic kidney disease, stage 3 unspecified: Secondary | ICD-10-CM | POA: Insufficient documentation

## 2015-05-19 DIAGNOSIS — I10 Essential (primary) hypertension: Secondary | ICD-10-CM | POA: Insufficient documentation

## 2015-05-26 DIAGNOSIS — E119 Type 2 diabetes mellitus without complications: Secondary | ICD-10-CM | POA: Insufficient documentation

## 2015-05-26 DIAGNOSIS — I10 Essential (primary) hypertension: Secondary | ICD-10-CM | POA: Insufficient documentation

## 2015-07-07 ENCOUNTER — Telehealth: Payer: Self-pay | Admitting: *Deleted

## 2015-07-07 NOTE — Telephone Encounter (Signed)
I have spoken with dtr. Christie today.  Pt. has an appt. with RAS on 07-22-15 at 1440--RAS will be out of the office during this time--can see her a little later on the same day.  Adonis Brook is agreeable--appt. moved to [place] will call me back if she needs to change it/fim

## 2015-07-22 ENCOUNTER — Ambulatory Visit: Payer: Self-pay | Admitting: Neurology

## 2015-07-22 ENCOUNTER — Ambulatory Visit: Payer: Medicare Other | Admitting: Neurology

## 2015-07-28 ENCOUNTER — Ambulatory Visit (INDEPENDENT_AMBULATORY_CARE_PROVIDER_SITE_OTHER): Payer: Medicare Other | Admitting: Neurology

## 2015-07-28 ENCOUNTER — Encounter: Payer: Self-pay | Admitting: Neurology

## 2015-07-28 VITALS — BP 158/62 | HR 68 | Resp 18 | Ht 62.5 in | Wt 138.0 lb

## 2015-07-28 DIAGNOSIS — G459 Transient cerebral ischemic attack, unspecified: Secondary | ICD-10-CM

## 2015-07-28 DIAGNOSIS — I6523 Occlusion and stenosis of bilateral carotid arteries: Secondary | ICD-10-CM

## 2015-07-28 DIAGNOSIS — E785 Hyperlipidemia, unspecified: Secondary | ICD-10-CM | POA: Diagnosis not present

## 2015-07-28 DIAGNOSIS — I10 Essential (primary) hypertension: Secondary | ICD-10-CM | POA: Diagnosis not present

## 2015-07-28 NOTE — Progress Notes (Signed)
[facility]  PATIENT: [name] DOB: 1924-08-12  REFERRING DOCTOR OR PCP:  Dr. Rita Ohara SOURCE: Patient and records  _________________________________   HISTORICAL  CHIEF COMPLAINT:  Chief Complaint  Patient presents with  . Transient Ischemic Attack    Denies new tia/cva sx.  Sts. she stopped Labetalol and dizziness resolved./fim  . Carotid Artery Stenosis    HISTORY OF PRESENT ILLNESS:  [name] is an 82 year old woman with a history of TIA with an episode of aphasia in mid February lasting 2-3 minutes.   She was ordering a pizza and had trouble getting the words.   She got out sounds and grunts but not formed words.    A couple minutes later, her speech had cleared.  During the episode, she also had mild reduced use of her right arm.     The next day, she felt weak in her right arm and had trouble pouring from a pot.   I last saw her 03/24/15 after the TIA/RIND.    Homocysteine was elevated and she started Foltx.   The carotid Doppler shows 50-59% stenosis on the left and lesser stenosis on the right.    The MRI of the brain showed some chronic microvascular ischemic change and mild atrophy, probably within typical limits for age.    The MRA showed left middle cerebral artery stenosis and that could be hemodynamically significant and could be related to her TIA.   In 2007, she had a TIA, also with reduced speech.    The TIA occurred while driving and she felt strange and pulled over.  Symptoms lasted 3-5 minutes.    Carotid doppler showed 40-59% stenosis bilaterally.     In 2015, she had a repeat Carotid doppler showing 40-59% stenosis bilaterally.     She has several CVA risk factors including hypercholesterolemia, HTN and pre-diabetes.   Also, she has bilateral carotid stenosis.     REVIEW OF SYSTEMS: Constitutional: No fevers, chills, sweats, or change in appetite Eyes: No visual changes, double vision, eye pain Ear, nose and throat: No  hearing loss, ear pain, nasal congestion, sore throat Cardiovascular: No chest pain, palpitations Respiratory: No shortness of breath at rest or with exertion.   No wheezes GastrointestinaI: No nausea, vomiting, diarrhea, abdominal pain, fecal incontinence Genitourinary: She has mild frequency and some nocturia. Musculoskeletal: Some neck pain > back pain Integumentary: No rash, pruritus, skin lesions Neurological: as above Psychiatric: No depression at this time.  No anxiety Endocrine: No palpitations, diaphoresis, change in appetite, change in weigh or increased thirst Hematologic/Lymphatic: No anemia, purpura, petechiae. Allergic/Immunologic: No itchy/runny eyes, nasal congestion, recent allergic reactions, rashes  ALLERGIES: Allergies  Allergen Reactions  . Darvon [Propoxyphene Hcl] Anaphylaxis  . Codeine Nausea And Vomiting    HOME MEDICATIONS:  Current outpatient prescriptions:  .  aspirin 81 MG tablet, Take 81 mg by mouth daily., Disp: , Rfl:  .  Calcium Carbonate-Vitamin D (CALCIUM + D PO), Take by mouth., Disp: , Rfl:  .  clopidogrel (PLAVIX) 75 MG tablet, One po daily, Disp: 90 tablet, Rfl: 3 .  folic acid-pyridoxine-cyancobalamin (FOLTX) 2.5-25-2 MG TABS tablet, Take 1 tablet by mouth daily., Disp: 30 each, Rfl: 11 .  lisinopril-hydrochlorothiazide (PRINZIDE,ZESTORETIC) 10-12.5 MG per tablet, daily., Disp: , Rfl:  .  Lovastatin (MEVACOR PO), Take by mouth daily at 12 noon., Disp: , Rfl:  .  Multiple Vitamin (MULTIVITAMIN) capsule, Take 1 capsule by mouth daily., Disp: , Rfl:  .  omeprazole (PRILOSEC)  40 MG capsule, Take 1 capsule (40 mg total) by mouth daily., Disp: 30 capsule, Rfl: 0 .  Probiotic Product (PROBIOTIC FORMULA PO), Take by mouth., Disp: , Rfl:  .  travoprost, benzalkonium, (TRAVATAN) 0.004 % ophthalmic solution, 1 drop at bedtime., Disp: , Rfl:   PAST MEDICAL HISTORY: Past Medical History  Diagnosis Date  . Glaucoma   . History of breast cancer   .  Hypertension   . Heart murmur   . Cancer ([place])   . Diabetes mellitus without complication ([place])   . Chronic kidney disease   . TIA (transient ischemic attack)     PAST SURGICAL HISTORY: Past Surgical History  Procedure Laterality Date  . Mastectomy  09/05/2006    left  . Abdominal hysterectomy  1970's  . Laparoscopic cholecystectomy  2001  . Squamous cell carcinoma excision  2000    nose  . Cataract extraction  2008    FAMILY HISTORY: Family History  Problem Relation Age of Onset  . Stroke Father   . Heart disease Mother   . Heart disease Sister   . Diabetes Sister     SOCIAL HISTORY:  Social History   Social History  . Marital Status: Married    Spouse Name: N/A  . Number of Children: N/A  . Years of Education: N/A   Occupational History  . Not on file.   Social History Main Topics  . Smoking status: Never Smoker   . Smokeless tobacco: Not on file  . Alcohol Use: No  . Drug Use: No  . Sexual Activity: Not on file   Other Topics Concern  . Not on file   Social History Narrative     PHYSICAL EXAM  Filed Vitals:   07/28/15 1406  BP: 158/62  Pulse: 68  Resp: 18  Height: 5' 2.5" (1.588 m)  Weight: 138 lb (62.596 kg)    Body mass index is 24.82 kg/(m^2).   General: The patient is well-developed and well-nourished and in no acute distress   Neurologic Exam  Mental status: The patient is alert and oriented x 3 at the time of the examination. The patient has apparent normal recent and remote memory, with an apparently normal attention span and concentration ability.   Speech is normal.  Cranial nerves: Extraocular movements are full.  There is good facial sensation to soft touch bilaterally.Facial strength is normal.  Trapezius and sternocleidomastoid strength is normal. No dysarthria is noted.  The tongue is midline, and the patient has symmetric elevation of the soft palate. No obvious hearing deficits are noted.  Motor:  Muscle bulk is normal.    Tone is normal. Strength is  5 / 5 in all 4 extremities.   Sensory: Sensory testing is intact to  soft touch and vibration sensation in all 4 extremities.  Coordination: Cerebellar testing reveals good finger-nose-finger and heel-to-shin bilaterally.  Gait and station: Station is normal.   Gait is normal for age. Romberg is negative.   Reflexes: Deep tendon reflexes are brisk at the knees with spreadl bilaterally.       DIAGNOSTIC DATA (LABS, IMAGING, TESTING) - I reviewed patient records, labs, notes, testing and imaging myself where available.  Lab Results  Component Value Date   WBC 8.2 09/03/2006   HGB 12.2 09/03/2006   HCT 36.0 09/03/2006   MCV 91.5 09/03/2006   PLT 347 09/03/2006      Component Value Date/Time   NA 135 09/03/2006 1110   K 3.8 09/03/2006 1110  CL 94* 09/03/2006 1110   CO2 30 09/03/2006 1110   GLUCOSE 99 09/03/2006 1110   BUN 19 09/03/2006 1110   CREATININE 1.20 09/03/2006 1110   CALCIUM 10.7* 09/03/2006 1110   PROT 7.4 09/03/2006 1110   ALBUMIN 4.3 09/03/2006 1110   AST 26 09/03/2006 1110   ALT 12 09/03/2006 1110   ALKPHOS 57 09/03/2006 1110   BILITOT 0.7 09/03/2006 1110   GFRNONAA 43* 09/03/2006 1110   GFRAA * 09/03/2006 1110    52        The eGFR has been calculated using the MDRD equation. This calculation has not been validated in all clinical       ASSESSMENT AND PLAN  Transient cerebral ischemia, unspecified transient cerebral ischemia type  Carotid stenosis, bilateral  Essential hypertension, benign  Hyperlipidemia   1.   Continue Plavix 75 mg daily and aspirin 81 mg daily 2.   Continue Foltx daily for increased homocysteine 3.   Stay active and exercises as tolerated. 4.    She or her daughter are advised to call me if she has any new or worsening neurologic symptoms.    [name] A. Felecia Shelling, MD, PhD 06/16/3356, 2:51 PM Certified in Neurology, Clinical Neurophysiology, Sleep Medicine, Pain Medicine and  Neuroimaging  [facility] [address], [place], [place] [address] [phone]

## 2016-04-11 ENCOUNTER — Other Ambulatory Visit: Payer: Self-pay | Admitting: Neurology

## 2016-07-26 ENCOUNTER — Ambulatory Visit (INDEPENDENT_AMBULATORY_CARE_PROVIDER_SITE_OTHER): Payer: Medicare Other | Admitting: Neurology

## 2016-07-26 ENCOUNTER — Encounter (INDEPENDENT_AMBULATORY_CARE_PROVIDER_SITE_OTHER): Payer: Self-pay

## 2016-07-26 ENCOUNTER — Encounter: Payer: Self-pay | Admitting: Neurology

## 2016-07-26 VITALS — BP 156/78 | HR 70 | Resp 18 | Ht 62.5 in | Wt 147.5 lb

## 2016-07-26 DIAGNOSIS — G459 Transient cerebral ischemic attack, unspecified: Secondary | ICD-10-CM

## 2016-07-26 DIAGNOSIS — I6523 Occlusion and stenosis of bilateral carotid arteries: Secondary | ICD-10-CM | POA: Diagnosis not present

## 2016-07-26 DIAGNOSIS — R200 Anesthesia of skin: Secondary | ICD-10-CM

## 2016-07-26 DIAGNOSIS — I669 Occlusion and stenosis of unspecified cerebral artery: Secondary | ICD-10-CM | POA: Diagnosis not present

## 2016-07-26 MED ORDER — CLOPIDOGREL BISULFATE 75 MG PO TABS: ORAL_TABLET | ORAL | 4 refills | Status: DC

## 2016-07-26 MED ORDER — FA-PYRIDOXINE-CYANOCOBALAMIN 2.5-25-2 MG PO TABS: 1.0000 | ORAL_TABLET | Freq: Every day | ORAL | 4 refills | Status: DC

## 2016-07-26 NOTE — Progress Notes (Signed)
[facility]  PATIENT: [name] DOB: July 22, 1924  REFERRING DOCTOR OR PCP:  Dr. Rita Ohara SOURCE: Patient and records  _________________________________   HISTORICAL  CHIEF COMPLAINT:  Chief Complaint  Patient presents with  . History of TIA    She c/o numbness right arm/hand onset about 3 mos. ago.  Denies further episodes of aphasia. Sts. is compliant with Plavix and ASA 34m/fim  . Episodes of Aphasia    HISTORY OF PRESENT ILLNESS:  Amber Marksberryis an 83year old woman with a history of TIA.   She denies any new neurologic symptoms consistent with TIA (past year. However, she has noted some numbness and tingling in the right arm.  Last year, she had MR angiogram of the brain and MRI. Images were personally reviewed.   There is no evidence of stroke. She does have some chronic microvessel ischemic change. There was left M2 stenosis on the MRA. Carotid Doppler studies showed moderate left ICA atherosclerotic plaque consistent with a 50-69% narrowing and there was milder right-sided atherosclerotic plaque.  She is noting tingling in her right arm and hand.    It wakes her up.   If she moves the arm around, she feels better a while.    The tingling is numb sensation without actual pain and she feels weak while the numbness is present.   The quality is pins and needles.   She sleeps with her right arm under her pillow.    Sometimes similar milder symptoms occur while reading or watching TV.     TIAs: She had a TIA 2007 associated with reduced speech. Symptoms lasted 3-5 minutes and Doppler study showed 40-59% stenosis bilaterally. In 03/24/2015, she had a TIA/RIND.      She has several CVA risk factors including hypercholesterolemia, HTN and pre-diabetes.   Also, she has bilateral carotid stenosis.     REVIEW OF SYSTEMS: Constitutional: No fevers, chills, sweats, or change in appetite Eyes: No visual changes, double vision, eye pain Ear, nose and throat: No  hearing loss, ear pain, nasal congestion, sore throat Cardiovascular: No chest pain, palpitations Respiratory: No shortness of breath at rest or with exertion.   No wheezes GastrointestinaI: No nausea, vomiting, diarrhea, abdominal pain, fecal incontinence Genitourinary: She has mild frequency and some nocturia. Musculoskeletal: Some neck pain > back pain Integumentary: No rash, pruritus, skin lesions Neurological: as above Psychiatric: No depression at this time.  No anxiety Endocrine: No palpitations, diaphoresis, change in appetite, change in weigh or increased thirst Hematologic/Lymphatic: No anemia, purpura, petechiae. Allergic/Immunologic: No itchy/runny eyes, nasal congestion, recent allergic reactions, rashes  ALLERGIES: Allergies  Allergen Reactions  . Darvon [Propoxyphene Hcl] Anaphylaxis  . Codeine Nausea And Vomiting    HOME MEDICATIONS:  Current Outpatient Prescriptions:  .  aspirin 81 MG tablet, Take 81 mg by mouth daily., Disp: , Rfl:  .  Calcium Carbonate-Vitamin D (CALCIUM + D PO), Take by mouth., Disp: , Rfl:  .  clopidogrel (PLAVIX) 75 MG tablet, One po daily, Disp: 90 tablet, Rfl: 4 .  folic acid-pyridoxine-cyancobalamin (FOLBIC) 2.5-25-2 MG TABS tablet, Take 1 tablet by mouth daily., Disp: 90 tablet, Rfl: 4 .  lisinopril-hydrochlorothiazide (PRINZIDE,ZESTORETIC) 10-12.5 MG per tablet, daily., Disp: , Rfl:  .  Multiple Vitamin (MULTIVITAMIN) capsule, Take 1 capsule by mouth daily., Disp: , Rfl:  .  Probiotic Product (PROBIOTIC FORMULA PO), Take by mouth., Disp: , Rfl:  .  travoprost, benzalkonium, (TRAVATAN) 0.004 % ophthalmic solution, 1 drop at bedtime., Disp: , Rfl:  PAST MEDICAL HISTORY: Past Medical History:  Diagnosis Date  . Cancer ([place])   . Chronic kidney disease   . Diabetes mellitus without complication ([place])   . Glaucoma   . Heart murmur   . History of breast cancer   . Hypertension   . TIA (transient ischemic attack)     PAST SURGICAL  HISTORY: Past Surgical History:  Procedure Laterality Date  . ABDOMINAL HYSTERECTOMY  1970's  . CATARACT EXTRACTION  2008  . LAPAROSCOPIC CHOLECYSTECTOMY  2001  . MASTECTOMY  09/05/2006   left  . SQUAMOUS CELL CARCINOMA EXCISION  2000   nose    FAMILY HISTORY: Family History  Problem Relation Age of Onset  . Stroke Father   . Heart disease Mother   . Heart disease Sister   . Diabetes Sister     SOCIAL HISTORY:  Social History   Social History  . Marital status: Married    Spouse name: N/A  . Number of children: N/A  . Years of education: N/A   Occupational History  . Not on file.   Social History Main Topics  . Smoking status: Never Smoker  . Smokeless tobacco: Never Used  . Alcohol use No  . Drug use: No  . Sexual activity: Not on file   Other Topics Concern  . Not on file   Social History Narrative  . No narrative on file     PHYSICAL EXAM  Vitals:   07/26/16 1532  BP: (!) 156/78  Pulse: 70  Resp: 18  Weight: 147 lb 8 oz (66.9 kg)  Height: 5' 2.5" (1.588 m)    Body mass index is 26.55 kg/m.   General: The patient is well-developed and well-nourished and in no acute distress  Neck/Ext:  She has tenderness over the spiral groove (radial nerve) on the right. There is no Tinel sign at the wrist or elbow.   Neck has good range of motion.   Neurologic Exam  Mental status: The patient is alert and oriented x 3 at the time of the examination. The patient has apparent normal recent and remote memory, with an apparently normal attention span and concentration ability.   Speech is normal.  Cranial nerves: Extraocular movements are full.  Facial strength and sensation is normal. There is no dysarthria. Trapezius strength is normal. The tongue is midline, and the patient has symmetric elevation of the soft palate. No obvious hearing deficits are noted.  Motor:  Muscle bulk is normal.   Tone is normal. Strength is  5 / 5 in all 4 extremities.   Sensory:  Sensory testing is intact to touch and vibration in the arms.  Coordination: Cerebellar testing reveals good finger-nose-finger and heel-to-shin bilaterally.  Gait and station: Station is normal.   Gait is normal for age. Romberg is negative.   Reflexes: Deep tendon reflexes are brisk at the knees with spreadl bilaterally.       DIAGNOSTIC DATA (LABS, IMAGING, TESTING) - I reviewed patient records, labs, notes, testing and imaging myself where available.  Lab Results  Component Value Date   WBC 8.2 09/03/2006   HGB 12.2 09/03/2006   HCT 36.0 09/03/2006   MCV 91.5 09/03/2006   PLT 347 09/03/2006      Component Value Date/Time   NA 135 09/03/2006 1110   K 3.8 09/03/2006 1110   CL 94 (L) 09/03/2006 1110   CO2 30 09/03/2006 1110   GLUCOSE 99 09/03/2006 1110   BUN 19 09/03/2006 1110  CREATININE 1.20 09/03/2006 1110   CALCIUM 10.7 (H) 09/03/2006 1110   PROT 7.4 09/03/2006 1110   ALBUMIN 4.3 09/03/2006 1110   AST 26 09/03/2006 1110   ALT 12 09/03/2006 1110   ALKPHOS 57 09/03/2006 1110   BILITOT 0.7 09/03/2006 1110   GFRNONAA 43 (L) 09/03/2006 1110   GFRAA (L) 09/03/2006 1110    52        The eGFR has been calculated using the MDRD equation. This calculation has not been validated in all clinical       ASSESSMENT AND PLAN  Transient cerebral ischemia, unspecified type  Bilateral carotid artery stenosis  Stenosis of intracranial vessel  Numbness  1.   Continue Plavix 75 mg daily. Continue aspirin 81 mg daily.   2.   Continue Foltx daily for increased homocysteine 3.   Change omeprazole to Pepcid as omeprazole can reduce effectiveness of Plavix. 4.   Stay active and exercises as tolerated. 5.   The right arm numbness is likely a peripheral issue such as a mild radial neuropathy or lower cervical radiculopathy. I have advised her to change pillows to try to avoid sleeping on the right side.  6.   Return in 1 year. She or her daughter are advised to call me if she  has any new or worsening neurologic symptoms.    Kert Shackett A. Felecia Shelling, MD, PhD 0/56/7889, 3:38 PM Certified in Neurology, Clinical Neurophysiology, Sleep Medicine, Pain Medicine and Neuroimaging  [facility] [address] [place], [place] [address] [phone]

## 2016-07-27 ENCOUNTER — Ambulatory Visit: Payer: Medicare Other | Admitting: Neurology

## 2016-10-12 ENCOUNTER — Encounter: Payer: Self-pay | Admitting: Podiatry

## 2016-10-12 ENCOUNTER — Ambulatory Visit (INDEPENDENT_AMBULATORY_CARE_PROVIDER_SITE_OTHER): Payer: Medicare Other | Admitting: Podiatry

## 2016-10-12 DIAGNOSIS — L97301 Non-pressure chronic ulcer of unspecified ankle limited to breakdown of skin: Secondary | ICD-10-CM

## 2016-10-12 DIAGNOSIS — M79672 Pain in left foot: Secondary | ICD-10-CM | POA: Diagnosis not present

## 2016-10-12 DIAGNOSIS — B351 Tinea unguium: Secondary | ICD-10-CM | POA: Diagnosis not present

## 2016-10-12 DIAGNOSIS — M79671 Pain in right foot: Secondary | ICD-10-CM | POA: Diagnosis not present

## 2016-10-12 DIAGNOSIS — M2041 Other hammer toe(s) (acquired), right foot: Secondary | ICD-10-CM

## 2016-10-12 DIAGNOSIS — I6523 Occlusion and stenosis of bilateral carotid arteries: Secondary | ICD-10-CM

## 2016-10-12 NOTE — Patient Instructions (Signed)
Seen for painful corn 2nd toe right. All nails and calluses debrided. Buttress pad placed under 2nd toe right, gel toe spreader dispensed x 1. Return in 3 month for routine foot care or come sooner if needed.

## 2016-10-12 NOTE — Progress Notes (Signed)
SUBJECTIVE: 83 y.o. year old female presents complaining of painful feet. 2nd toe right foot is very painful to walk.  Lives alone and must care for herself.  REVIEW OF SYSTEMS: Pertinent items noted in HPI and remainder of comprehensive ROS otherwise negative.  OBJECTIVE: DERMATOLOGIC EXAMINATION: Nails: Thick dystrophic nails x 10. Ulcerative corn distal end 2nd right, plantar ball 5th MPJ area left.  VASCULAR EXAMINATION OF LOWER LIMBS: All pedal pulses are palpable with normal pulsation.  Capillary Filling times within 3 seconds in all digits.  No associated edema or erythema noted. Temperature gradient from tibial crest to dorsum of foot is within normal bilateral.  NEUROLOGIC EXAMINATION OF THE LOWER LIMBS: Achilles DTR is present and within normal. Monofilament (Semmes-Weinstein 10-gm) sensory testing positive 6 out of 6, bilateral. Vibratory sensations(128Hz  turning fork) intact at medial and lateral forefoot bilateral.  Sharp and Dull discriminatory sensations at the plantar ball of hallux is intact bilateral.   MUSCULOSKELETAL EXAMINATION: Positive for severe hallux valgus with bunion deformities bilateral. Severe hammer toe deformity 2nd right with pre ulcerative digital corn.  ASSESSMENT: Pre ulcerative digital corn 2nd right. Severe digital contracture 2nd right. Severe HAV with bunion deformity bilateral. Thick dystrophic nails x 10.  PLAN: Reviewed clinical findings and available treatment options. 2nd digit right debrided and buttress pad placed. Debrided 5th MPJ area left foot. All nails debrided. Gel toe spreader dispensed to use a the first interdigital space right foot.  Return in 3 months or sooner if pain starts.

## 2017-01-11 ENCOUNTER — Ambulatory Visit: Payer: Medicare Other | Admitting: Podiatry

## 2017-02-20 ENCOUNTER — Ambulatory Visit (INDEPENDENT_AMBULATORY_CARE_PROVIDER_SITE_OTHER): Payer: Medicare Other | Admitting: Podiatry

## 2017-02-20 DIAGNOSIS — M79672 Pain in left foot: Secondary | ICD-10-CM | POA: Diagnosis not present

## 2017-02-20 DIAGNOSIS — B351 Tinea unguium: Secondary | ICD-10-CM

## 2017-02-20 DIAGNOSIS — M79671 Pain in right foot: Secondary | ICD-10-CM

## 2017-02-20 NOTE — Patient Instructions (Signed)
Seen for hypertrophic nails. All nails debrided. Gel toe spreader dispensed x 2. Return in 3 months or as needed.

## 2017-02-21 ENCOUNTER — Encounter: Payer: Self-pay | Admitting: Podiatry

## 2017-02-21 NOTE — Progress Notes (Signed)
Subjective: 84 y.o. year old female patient presents complaining of painful toes. She is using cotton pad in between the 1st and 2nd toe. Patient lives alone.  Objective: Dermatologic: Hypertrophic nails x 10. No open skin lesions noted. Excess pressure mark from overlapping first and 2nd toes bilaeral. Vascular: Pedal pulses are all palpable. Orthopedic: Severe hallux valgus with bunion, overlapping 1st and 2nd toe with excess inter digital pressure. Neurologic: All epicritic and tactile sensations grossly intact.  Assessment: Dystrophic mycotic nails x 10. Pre ulcerative inter digital pressure 1st and 2nd bilateral. Severe HAV with bunion bilateral.  Treatment: All mycotic nails, corns, calluses debrided.  Gel toe spreader dispensed x 2. Return in 3 months or as needed.

## 2017-05-22 ENCOUNTER — Ambulatory Visit: Payer: Medicare Other | Admitting: Podiatry

## 2017-07-31 ENCOUNTER — Ambulatory Visit: Payer: Medicare Other | Admitting: Neurology

## 2017-08-09 ENCOUNTER — Ambulatory Visit: Payer: Medicare Other | Admitting: Neurology

## 2017-09-18 ENCOUNTER — Ambulatory Visit (INDEPENDENT_AMBULATORY_CARE_PROVIDER_SITE_OTHER): Payer: Medicare Other | Admitting: Neurology

## 2017-09-18 ENCOUNTER — Other Ambulatory Visit: Payer: Self-pay

## 2017-09-18 ENCOUNTER — Encounter: Payer: Self-pay | Admitting: Neurology

## 2017-09-18 ENCOUNTER — Other Ambulatory Visit: Payer: Self-pay | Admitting: *Deleted

## 2017-09-18 DIAGNOSIS — G459 Transient cerebral ischemic attack, unspecified: Secondary | ICD-10-CM | POA: Diagnosis not present

## 2017-09-18 DIAGNOSIS — I6523 Occlusion and stenosis of bilateral carotid arteries: Secondary | ICD-10-CM

## 2017-09-18 DIAGNOSIS — R748 Abnormal levels of other serum enzymes: Secondary | ICD-10-CM

## 2017-09-18 DIAGNOSIS — R7989 Other specified abnormal findings of blood chemistry: Secondary | ICD-10-CM

## 2017-09-18 DIAGNOSIS — I10 Essential (primary) hypertension: Secondary | ICD-10-CM | POA: Diagnosis not present

## 2017-09-18 NOTE — Progress Notes (Signed)
[facility]  PATIENT: [name] DOB: 09/28/1924  REFERRING DOCTOR OR PCP:  Dr. Rita Ohara SOURCE: Patient and records  _________________________________   HISTORICAL  CHIEF COMPLAINT:  Chief Complaint  Patient presents with  . Carotid Artery Stenosis    Reports compliance with Plavix, ASA, Foltx/fim    HISTORY OF PRESENT ILLNESS:  [name] is an 84 year old woman with a history of TIA.     Update 09/18/2017: She feels she is stable and notes no new neurological symptoms.    She feels generally weaker and needs to pace herself more with walking.      Her last carotid doppler study was 04/13/2015.    She has moderate 50 to 69% stenosis on the left and minimal atherosclerotic stenosis on the right.    She has had some more trouble getting BP controlled.   Amlodipine caused ankle swelling.    She went back on chlorthalidone.   However after a hospital admission, the chlorthalidone was switched back to amlodipine.    She has been on Plavix and aspirin since the TIA and 2007 with speech symptoms.   Stroke risk factors include elevated cholesterol, hypertension and prediabetes.    From 07/26/2016: She denies any new neurologic symptoms consistent with TIA (past year. However, she has noted some numbness and tingling in the right arm.  Last year, she had MR angiogram of the brain and MRI. Images were personally reviewed.   There is no evidence of stroke. She does have some chronic microvessel ischemic change. There was left M2 stenosis on the MRA. Carotid Doppler studies showed moderate left ICA atherosclerotic plaque consistent with a 50-69% narrowing and there was milder right-sided atherosclerotic plaque.  She is noting tingling in her right arm and hand.    It wakes her up.   If she moves the arm around, she feels better a while.    The tingling is numb sensation without actual pain and she feels weak while the numbness is present.   The quality is pins and  needles.   She sleeps with her right arm under her pillow.    Sometimes similar milder symptoms occur while reading or watching TV.     TIAs: She had a TIA 2007 associated with reduced speech. Symptoms lasted 3-5 minutes and Doppler study showed 40-59% stenosis bilaterally. In 03/24/2015, she had a TIA/RIND.      She has several CVA risk factors including hypercholesterolemia, HTN and pre-diabetes.   Also, she has bilateral carotid stenosis.     REVIEW OF SYSTEMS: Constitutional: No fevers, chills, sweats, or change in appetite Eyes: No visual changes, double vision, eye pain Ear, nose and throat: No hearing loss, ear pain, nasal congestion, sore throat Cardiovascular: No chest pain, palpitations Respiratory: No shortness of breath at rest or with exertion.   No wheezes GastrointestinaI: No nausea, vomiting, diarrhea, abdominal pain, fecal incontinence Genitourinary: She has mild frequency and some nocturia. Musculoskeletal: Some neck pain > back pain Integumentary: No rash, pruritus, skin lesions Neurological: as above Psychiatric: No depression at this time.  No anxiety Endocrine: No palpitations, diaphoresis, change in appetite, change in weigh or increased thirst Hematologic/Lymphatic: No anemia, purpura, petechiae. Allergic/Immunologic: No itchy/runny eyes, nasal congestion, recent allergic reactions, rashes  ALLERGIES: Allergies  Allergen Reactions  . Darvon [Propoxyphene Hcl] Anaphylaxis  . Codeine Nausea And Vomiting  . Lisinopril Swelling    facial edema    HOME MEDICATIONS:  Current Outpatient Medications:  .  amLODipine (NORVASC)  2.5 MG tablet, Take 2.5 mg by mouth daily., Disp: , Rfl:  .  aspirin 81 MG tablet, Take 81 mg by mouth daily., Disp: , Rfl:  .  Calcium Carbonate-Vitamin D (CALCIUM + D PO), Take by mouth., Disp: , Rfl:  .  clopidogrel (PLAVIX) 75 MG tablet, One po daily, Disp: 90 tablet, Rfl: 4 .  Ferrous Sulfate (IRON) 325 (65 Fe) MG TABS, Take by  mouth., Disp: , Rfl:  .  folic acid-pyridoxine-cyancobalamin (FOLBIC) 2.5-25-2 MG TABS tablet, Take 1 tablet by mouth daily., Disp: 90 tablet, Rfl: 4 .  lovastatin (MEVACOR) 40 MG tablet, Take 40 mg by mouth at bedtime., Disp: , Rfl:  .  Multiple Vitamin (MULTIVITAMIN) capsule, Take 1 capsule by mouth daily., Disp: , Rfl:  .  travoprost, benzalkonium, (TRAVATAN) 0.004 % ophthalmic solution, 1 drop at bedtime., Disp: , Rfl:   PAST MEDICAL HISTORY: Past Medical History:  Diagnosis Date  . Cancer ([place])   . Chronic kidney disease   . Diabetes mellitus without complication ([place])   . Glaucoma   . Heart murmur   . History of breast cancer   . Hypertension   . TIA (transient ischemic attack)     PAST SURGICAL HISTORY: Past Surgical History:  Procedure Laterality Date  . ABDOMINAL HYSTERECTOMY  1970's  . CATARACT EXTRACTION  2008  . LAPAROSCOPIC CHOLECYSTECTOMY  2001  . MASTECTOMY  09/05/2006   left  . SQUAMOUS CELL CARCINOMA EXCISION  2000   nose    FAMILY HISTORY: Family History  Problem Relation Age of Onset  . Stroke Father   . Heart disease Mother   . Heart disease Sister   . Diabetes Sister     SOCIAL HISTORY:  Social History   Socioeconomic History  . Marital status: Married    Spouse name: Not on file  . Number of children: Not on file  . Years of education: Not on file  . Highest education level: Not on file  Occupational History  . Not on file  Social Needs  . Financial resource strain: Not on file  . Food insecurity:    Worry: Not on file    Inability: Not on file  . Transportation needs:    Medical: Not on file    Non-medical: Not on file  Tobacco Use  . Smoking status: Never Smoker  . Smokeless tobacco: Never Used  Substance and Sexual Activity  . Alcohol use: No  . Drug use: No  . Sexual activity: Not on file  Lifestyle  . Physical activity:    Days per week: Not on file    Minutes per session: Not on file  . Stress: Not on file    Relationships  . Social connections:    Talks on phone: Not on file    Gets together: Not on file    Attends religious service: Not on file    Active member of club or organization: Not on file    Attends meetings of clubs or organizations: Not on file    Relationship status: Not on file  . Intimate partner violence:    Fear of current or ex partner: Not on file    Emotionally abused: Not on file    Physically abused: Not on file    Forced sexual activity: Not on file  Other Topics Concern  . Not on file  Social History Narrative  . Not on file     PHYSICAL EXAM  There were no vitals filed for  this visit.  There is no height or weight on file to calculate BMI.  BP 185/75 on recheck   Pulse 68  General: The patient is well-developed and well-nourished and in no acute distress  Neck/CV:   She has carotid bruits bilaterally, louder on the left.  The heart has a regular rate and rhythm with normal sounds  Neurologic Exam  Mental status: The patient is alert and oriented x 3 at the time of the examination. The patient has apparent normal recent and remote memory, with an apparently normal attention span and concentration ability.   Speech is normal.  Cranial nerves: Extraocular movements are full.  Facial strength and sensation was normal.  The trapezius strength was normal.  There is no dysarthria.  The tongue is midline, and the patient has symmetric elevation of the soft palate. No obvious hearing deficits are noted.  Motor:  Muscle bulk is normal.   Tone is normal. Strength is  5 / 5 in all 4 extremities.   Sensory: Sensory testing is intact to touch and vibration in the arms.  Coordination: Cerebellar testing reveals good finger-nose-finger and heel-to-shin bilaterally.  Gait and station: Station is normal.   The gait is mildly wide..  The tandem is poor.  The Romberg is negative.  Reflexes: Deep tendon reflexes are brisk at the knees with spreadl bilaterally.        DIAGNOSTIC DATA (LABS, IMAGING, TESTING) - I reviewed patient records, labs, notes, testing and imaging myself where available.  Lab Results  Component Value Date   WBC 8.2 09/03/2006   HGB 12.2 09/03/2006   HCT 36.0 09/03/2006   MCV 91.5 09/03/2006   PLT 347 09/03/2006      Component Value Date/Time   NA 135 09/03/2006 1110   K 3.8 09/03/2006 1110   CL 94 (L) 09/03/2006 1110   CO2 30 09/03/2006 1110   GLUCOSE 99 09/03/2006 1110   BUN 19 09/03/2006 1110   CREATININE 1.20 09/03/2006 1110   CALCIUM 10.7 (H) 09/03/2006 1110   PROT 7.4 09/03/2006 1110   ALBUMIN 4.3 09/03/2006 1110   AST 26 09/03/2006 1110   ALT 12 09/03/2006 1110   ALKPHOS 57 09/03/2006 1110   BILITOT 0.7 09/03/2006 1110   GFRNONAA 43 (L) 09/03/2006 1110   GFRAA (L) 09/03/2006 1110    52        The eGFR has been calculated using the MDRD equation. This calculation has not been validated in all clinical       ASSESSMENT AND PLAN  Bilateral carotid artery stenosis - Plan: US Carotid Bilateral  TIA (transient ischemic attack)  Essential hypertension, benign  Elevated serum homocysteine level  1.   Continue aspirin and Plavix. 2.   Homocysteine was elevated in the past.   continue Folbic 3.   Stay active and exercises as tolerated. 4.    Return in 1 year. She or her daughter are advised to call me if she has any new or worsening neurologic symptoms.    Malaiah Viramontes A. Felecia Shelling, MD, PhD 0/93/2671, 2:45 PM Certified in Neurology, Clinical Neurophysiology, Sleep Medicine, Pain Medicine and Neuroimaging  [facility] [address], [place], [place] [address] [phone]

## 2017-09-18 NOTE — Progress Notes (Signed)
as

## 2017-09-20 ENCOUNTER — Encounter (HOSPITAL_BASED_OUTPATIENT_CLINIC_OR_DEPARTMENT_OTHER): Payer: Medicare Other

## 2017-09-21 ENCOUNTER — Encounter (HOSPITAL_COMMUNITY): Payer: Medicare Other

## 2017-09-24 ENCOUNTER — Other Ambulatory Visit: Payer: Self-pay | Admitting: Neurology

## 2017-10-11 ENCOUNTER — Ambulatory Visit (HOSPITAL_BASED_OUTPATIENT_CLINIC_OR_DEPARTMENT_OTHER)
Admission: RE | Admit: 2017-10-11 | Discharge: 2017-10-11 | Disposition: A | Discharge status: Home / Self Care | Payer: Medicare Other | Source: Ambulatory Visit | Attending: Neurology | Admitting: Neurology

## 2017-10-11 DIAGNOSIS — I6523 Occlusion and stenosis of bilateral carotid arteries: Secondary | ICD-10-CM | POA: Diagnosis present

## 2017-10-11 NOTE — Progress Notes (Addendum)
CAROTID DUPLEX PERFORMED BILATERALLY    Right Carotid: Velocities in the right ICA are consistent with a 1-39% stenosis. Non-hemodynamically significant plaque <50% noted in the CCA. The ECA appears <50% stenosed. Velocities in the right ICA are consistent with a 1-39% stenosis. Non-hemodynamically significant plaque <50% noted in the CCA. The ECA appears <50% stenosed. Multinodular right thyroid noted.    Left Carotid: Velocities in the left ICA are consistent with a 40-59% stenosis. Non-hemodynamically significant plaque noted in the CCA. The ECA appears <50% stenosed. Velocities in the left ICA are consistent with a 40-59% stenosis. Non-hemodynamically significant plaque noted in the CCA. The ECA appears <50% stenosed.  Multinodular left thyroid noted.    Vertebrals: Bilateral vertebral arteries demonstrate antegrade flow.    Subclavians: Normal flow hemodynamics were seen in bilateral subclavian arteries.    *See table(s) above for measurements and observations.   10/11/17 Cardell Peach RDCS, RVT

## 2017-10-12 ENCOUNTER — Telehealth: Payer: Self-pay | Admitting: *Deleted

## 2017-10-12 NOTE — Telephone Encounter (Signed)
Spoke with Mrs. Medley and reviewed below carotid doppler results and advised no med changes are needed.  She repeated results back to me, verbalized understanding of same/fim

## 2017-10-12 NOTE — Telephone Encounter (Signed)
-----   Message from Britt Bottom, MD sent at 10/11/2017  5:49 PM EDT ----- Please let her know that the carotid Doppler looked good.  She has about 50% stenosis on the left which is what she had the last time.  On the right she has less than 40% stenosis which is actually better than last time.  No new changes in medication needed.

## 2017-10-19 ENCOUNTER — Other Ambulatory Visit: Payer: Self-pay | Admitting: Neurology

## 2018-09-09 ENCOUNTER — Telehealth: Payer: Self-pay | Admitting: Neurology

## 2018-09-09 NOTE — Telephone Encounter (Signed)
I called patient and LVM to reschedule 8/21 appt due to our office being closed. Requested patient call back to r/s.

## 2018-09-10 ENCOUNTER — Encounter: Payer: Self-pay | Admitting: Neurology

## 2018-09-10 NOTE — Telephone Encounter (Signed)
8/21 appointment has been cancelled. Unable to get in contact with patient. Cancellation letter sent to advise pt.

## 2018-09-20 ENCOUNTER — Ambulatory Visit: Payer: Medicare Other | Admitting: Neurology

## 2018-09-23 ENCOUNTER — Other Ambulatory Visit: Payer: Self-pay

## 2018-09-23 ENCOUNTER — Encounter: Payer: Self-pay | Admitting: Neurology

## 2018-09-23 ENCOUNTER — Telehealth: Payer: Self-pay | Admitting: Neurology

## 2018-09-23 ENCOUNTER — Ambulatory Visit (INDEPENDENT_AMBULATORY_CARE_PROVIDER_SITE_OTHER): Payer: Medicare Other | Admitting: Neurology

## 2018-09-23 VITALS — BP 180/67 | HR 77 | Temp 97.7°F | Ht 62.5 in | Wt 146.0 lb

## 2018-09-23 DIAGNOSIS — I6523 Occlusion and stenosis of bilateral carotid arteries: Secondary | ICD-10-CM

## 2018-09-23 DIAGNOSIS — G459 Transient cerebral ischemic attack, unspecified: Secondary | ICD-10-CM

## 2018-09-23 DIAGNOSIS — R269 Unspecified abnormalities of gait and mobility: Secondary | ICD-10-CM | POA: Diagnosis not present

## 2018-09-23 DIAGNOSIS — I10 Essential (primary) hypertension: Secondary | ICD-10-CM

## 2018-09-23 MED ORDER — CLOPIDOGREL BISULFATE 75 MG PO TABS: ORAL_TABLET | ORAL | 4 refills | Status: DC

## 2018-09-23 NOTE — Telephone Encounter (Signed)
medicare/aarp order sent to GI. No auth they will reach out to the patient to schedule.  °

## 2018-09-23 NOTE — Progress Notes (Signed)
[facility]  PATIENT: [name] DOB: 12-21-1924  REFERRING DOCTOR OR PCP:  Dr. Rita Ohara SOURCE: Patient and records  _________________________________   HISTORICAL  CHIEF COMPLAINT:  Chief Complaint  Patient presents with  . Follow-up    RM 12 with daughter (temp: 96.9). Last seen 09/18/2017. Hx TIA. Takes plavix, ASA, Folix    HISTORY OF PRESENT ILLNESS:  [name] is an 85 year old woman with a history of TIA.     Update 09/23/2018: She denies any new TIA symptoms.   No episodes of TIA-like weakness, numbness or speech difficulty.      Her daughter (an MD) notes that she had an episode while walking back form the mailbox that she started walking backwards.  This was in her carport so no slope.  She was abl to grab a cabinet.     Sh notes mildly more urinary frequency.     She is having some problems with recall of words.   However, sentences are well constructed.      Update 09/18/2017: She feels she is stable and notes no new neurological symptoms.    She feels generally weaker and needs to pace herself more with walking.      Her last carotid doppler study was 04/13/2015.    She has moderate 50 to 69% stenosis on the left and minimal atherosclerotic stenosis on the right.    She has had some more trouble getting BP controlled.   Amlodipine caused ankle swelling.    She went back on chlorthalidone.   However after a hospital admission, the chlorthalidone was switched back to amlodipine.    She has been on Plavix and aspirin since the TIA and 2007 with speech symptoms.   Stroke risk factors include elevated cholesterol, hypertension and prediabetes.    From 07/26/2016: She denies any new neurologic symptoms consistent with TIA (past year. However, she has noted some numbness and tingling in the right arm.  Last year, she had MR angiogram of the brain and MRI. Images were personally reviewed.   There is no evidence of stroke. She does have some  chronic microvessel ischemic change. There was left M2 stenosis on the MRA. Carotid Doppler studies showed moderate left ICA atherosclerotic plaque consistent with a 50-69% narrowing and there was milder right-sided atherosclerotic plaque.  She is noting tingling in her right arm and hand.    It wakes her up.   If she moves the arm around, she feels better a while.    The tingling is numb sensation without actual pain and she feels weak while the numbness is present.   The quality is pins and needles.   She sleeps with her right arm under her pillow.    Sometimes similar milder symptoms occur while reading or watching TV.     TIAs: She had a TIA 2007 associated with reduced speech. Symptoms lasted 3-5 minutes and Doppler study showed 40-59% stenosis bilaterally. In 03/24/2015, she had a TIA/RIND.      She has several CVA risk factors including hypercholesterolemia, HTN and pre-diabetes.   Also, she has bilateral carotid stenosis.     REVIEW OF SYSTEMS: Constitutional: No fevers, chills, sweats, or change in appetite Eyes: No visual changes, double vision, eye pain Ear, nose and throat: No hearing loss, ear pain, nasal congestion, sore throat Cardiovascular: No chest pain, palpitations Respiratory: No shortness of breath at rest or with exertion.   No wheezes GastrointestinaI: No nausea, vomiting, diarrhea, abdominal pain,  fecal incontinence Genitourinary: She has mild frequency and some nocturia. Musculoskeletal: Some neck pain > back pain Integumentary: No rash, pruritus, skin lesions Neurological: as above Psychiatric: No depression at this time.  No anxiety Endocrine: No palpitations, diaphoresis, change in appetite, change in weigh or increased thirst Hematologic/Lymphatic: No anemia, purpura, petechiae. Allergic/Immunologic: No itchy/runny eyes, nasal congestion, recent allergic reactions, rashes  ALLERGIES: Allergies  Allergen Reactions  . Darvon [Propoxyphene Hcl] Anaphylaxis   . Codeine Nausea And Vomiting  . Lisinopril Swelling    facial edema    HOME MEDICATIONS:  Current Outpatient Medications:  .  Ascorbic Acid (VITA-C PO), Take 500 mg by mouth daily., Disp: , Rfl:  .  aspirin 81 MG tablet, Take 81 mg by mouth daily., Disp: , Rfl:  .  Calcium Carbonate-Vitamin D (CALCIUM + D PO), Take by mouth., Disp: , Rfl:  .  chlorthalidone (HYGROTON) 25 MG tablet, Take 12.5 mg by mouth daily., Disp: , Rfl:  .  clopidogrel (PLAVIX) 75 MG tablet, TAKE 1 TABLET BY MOUTH DAILY, Disp: 90 tablet, Rfl: 4 .  Ferrous Sulfate (IRON) 325 (65 Fe) MG TABS, Take 1 each by mouth every other day. , Disp: , Rfl:  .  folic acid-pyridoxine-cyancobalamin (FOLBIC) 2.5-25-2 MG TABS tablet, Take 1 tablet by mouth daily., Disp: 90 tablet, Rfl: 4 .  lovastatin (MEVACOR) 40 MG tablet, Take 40 mg by mouth at bedtime., Disp: , Rfl:  .  magnesium oxide (MAG-OX) 400 MG tablet, Take 400 mg by mouth daily., Disp: , Rfl:  .  Multiple Vitamin (MULTIVITAMIN) capsule, Take 1 capsule by mouth daily., Disp: , Rfl:  .  Multiple Vitamins-Minerals (ZINC PO), Take 25 mg by mouth daily., Disp: , Rfl:  .  omeprazole (PRILOSEC) 20 MG capsule, Take 20 mg by mouth daily., Disp: , Rfl:  .  potassium chloride (KLOR-CON) 20 MEQ packet, Take by mouth daily., Disp: , Rfl:  .  travoprost, benzalkonium, (TRAVATAN) 0.004 % ophthalmic solution, Place 1 drop into both eyes at bedtime. , Disp: , Rfl:   PAST MEDICAL HISTORY: Past Medical History:  Diagnosis Date  . Cancer ([place])   . Chronic kidney disease   . Diabetes mellitus without complication ([place])   . Glaucoma   . Heart murmur   . History of breast cancer   . Hypertension   . TIA (transient ischemic attack)     PAST SURGICAL HISTORY: Past Surgical History:  Procedure Laterality Date  . ABDOMINAL HYSTERECTOMY  1970's  . CATARACT EXTRACTION  2008  . LAPAROSCOPIC CHOLECYSTECTOMY  2001  . MASTECTOMY  09/05/2006   left  . SQUAMOUS CELL CARCINOMA EXCISION  2000    nose    FAMILY HISTORY: Family History  Problem Relation Age of Onset  . Stroke Father   . Heart disease Mother   . Heart disease Sister   . Diabetes Sister     SOCIAL HISTORY:  Social History   Socioeconomic History  . Marital status: Married    Spouse name: Not on file  . Number of children: Not on file  . Years of education: Not on file  . Highest education level: Not on file  Occupational History  . Not on file  Social Needs  . Financial resource strain: Not on file  . Food insecurity    Worry: Not on file    Inability: Not on file  . Transportation needs    Medical: Not on file    Non-medical: Not on file  Tobacco Use  .  Smoking status: Never Smoker  . Smokeless tobacco: Never Used  Substance and Sexual Activity  . Alcohol use: No  . Drug use: No  . Sexual activity: Not on file  Lifestyle  . Physical activity    Days per week: Not on file    Minutes per session: Not on file  . Stress: Not on file  Relationships  . Social Herbalist on phone: Not on file    Gets together: Not on file    Attends religious service: Not on file    Active member of club or organization: Not on file    Attends meetings of clubs or organizations: Not on file    Relationship status: Not on file  . Intimate partner violence    Fear of current or ex partner: Not on file    Emotionally abused: Not on file    Physically abused: Not on file    Forced sexual activity: Not on file  Other Topics Concern  . Not on file  Social History Narrative  . Not on file     PHYSICAL EXAM  Vitals:   09/23/18 1339  BP: (!) 180/67  Pulse: 77  Temp: 97.7 F (36.5 C)  Weight: 146 lb (66.2 kg)  Height: 5' 2.5" (1.588 m)    Body mass index is 26.28 kg/m.  BP 185/75 on recheck   Pulse 68  General: The patient is well-developed and well-nourished and in no acute distress  Neck/CV:   She has carotid bruits bilaterally, louder on the left.  The heart has a regular rate and  rhythm with normal sounds  Neurologic Exam  Mental status: The patient is alert and oriented x 3 at the time of the examination. The patient has apparent normal recent and remote memory, with an apparently normal attention span and concentration ability.   Speech is normal.  Cranial nerves: Extraocular movements are full.  Facial strength and sensation was normal.  The trapezius strength was normal.  There is no dysarthria.  The tongue is midline, and the patient has symmetric elevation of the soft palate. No obvious hearing deficits are noted.  Motor:  Muscle bulk is normal.   Tone is normal. Strength is  5 / 5 in all 4 extremities.   Sensory: Sensory testing is intact to touch and vibration in the arms.  Coordination: Cerebellar testing reveals good finger-nose-finger and heel-to-shin bilaterally.  Gait and station: Station is normal.   The gait has a mildly reduced stride and she takes 5 steps to turn.   She has mild retropulsion..  The tandem is poor.  The Romberg is negative.  Reflexes: Deep tendon reflexes are brisk at the knees with spreadl bilaterally.       DIAGNOSTIC DATA (LABS, IMAGING, TESTING) - I reviewed patient records, labs, notes, testing and imaging myself where available.  Lab Results  Component Value Date   WBC 8.2 09/03/2006   HGB 12.2 09/03/2006   HCT 36.0 09/03/2006   MCV 91.5 09/03/2006   PLT 347 09/03/2006      Component Value Date/Time   NA 135 09/03/2006 1110   K 3.8 09/03/2006 1110   CL 94 (L) 09/03/2006 1110   CO2 30 09/03/2006 1110   GLUCOSE 99 09/03/2006 1110   BUN 19 09/03/2006 1110   CREATININE 1.20 09/03/2006 1110   CALCIUM 10.7 (H) 09/03/2006 1110   PROT 7.4 09/03/2006 1110   ALBUMIN 4.3 09/03/2006 1110   AST 26 09/03/2006 1110  ALT 12 09/03/2006 1110   ALKPHOS 57 09/03/2006 1110   BILITOT 0.7 09/03/2006 1110   GFRNONAA 43 (L) 09/03/2006 1110   GFRAA (L) 09/03/2006 1110    52        The eGFR has been calculated using the MDRD  equation. This calculation has not been validated in all clinical       ASSESSMENT AND PLAN    1. TIA (transient ischemic attack)   2. Gait disturbance   3. Bilateral carotid artery stenosis   4. Benign hypertension     1.   Continue aspirin and Plavix.   Stop omeprazole (if GERD returns use famotidine instead) 2.   Homocysteine was elevated in the past.   continue Folbic 3.   Stay active and exercises as tolerated. 4.   Due to episode of gait disturbance and some gait changes since last visit, we will check the MRI brain to r/o NPH and assess ischemic changes  Return in 1 year. She or her daughter are advised to call me if she has any new or worsening neurologic symptoms.    Kenyen Candy A. Felecia Shelling, MD, PhD 1/38/8719, 5:97 PM Certified in Neurology, Clinical Neurophysiology, Sleep Medicine, Pain Medicine and Neuroimaging  [facility] [address], [place], [place] [address] [phone]

## 2018-10-15 ENCOUNTER — Other Ambulatory Visit: Payer: Self-pay | Admitting: Neurology

## 2018-10-30 ENCOUNTER — Other Ambulatory Visit: Payer: Self-pay | Admitting: Neurology

## 2018-11-06 ENCOUNTER — Other Ambulatory Visit: Payer: Medicare Other

## 2018-11-25 ENCOUNTER — Encounter: Payer: Self-pay | Admitting: Gastroenterology

## 2019-01-06 ENCOUNTER — Ambulatory Visit (INDEPENDENT_AMBULATORY_CARE_PROVIDER_SITE_OTHER): Payer: Medicare Other | Admitting: Gastroenterology

## 2019-01-06 ENCOUNTER — Other Ambulatory Visit (INDEPENDENT_AMBULATORY_CARE_PROVIDER_SITE_OTHER): Payer: Medicare Other

## 2019-01-06 ENCOUNTER — Encounter: Payer: Self-pay | Admitting: Gastroenterology

## 2019-01-06 ENCOUNTER — Other Ambulatory Visit: Payer: Self-pay

## 2019-01-06 VITALS — BP 134/60 | HR 79 | Temp 97.4°F | Ht 62.5 in | Wt 139.0 lb

## 2019-01-06 DIAGNOSIS — R197 Diarrhea, unspecified: Secondary | ICD-10-CM

## 2019-01-06 DIAGNOSIS — R159 Full incontinence of feces: Secondary | ICD-10-CM

## 2019-01-06 DIAGNOSIS — I6523 Occlusion and stenosis of bilateral carotid arteries: Secondary | ICD-10-CM | POA: Diagnosis not present

## 2019-01-06 DIAGNOSIS — R152 Fecal urgency: Secondary | ICD-10-CM

## 2019-01-06 LAB — HEPATIC FUNCTION PANEL
ALT: 7 U/L (ref 0–35)
AST: 15 U/L (ref 0–37)
Albumin: 4.4 g/dL (ref 3.5–5.2)
Alkaline Phosphatase: 41 U/L (ref 39–117)
Bilirubin, Direct: 0.1 mg/dL (ref 0.0–0.3)
Total Bilirubin: 0.4 mg/dL (ref 0.2–1.2)
Total Protein: 7.4 g/dL (ref 6.0–8.3)

## 2019-01-06 LAB — IBC + FERRITIN
Ferritin: 85.9 ng/mL (ref 10.0–291.0)
Iron: 55 ug/dL (ref 42–145)
Saturation Ratios: 17.1 % — ABNORMAL LOW (ref 20.0–50.0)
Transferrin: 230 mg/dL (ref 212.0–360.0)

## 2019-01-06 LAB — TSH: TSH: 3.22 u[IU]/mL (ref 0.35–4.50)

## 2019-01-06 LAB — FOLATE: Folate: >24.1 ng/mL (ref >5.9)

## 2019-01-06 LAB — VITAMIN B12: Vitamin B-12: >1500 pg/mL — ABNORMAL HIGH (ref 211–911)

## 2019-01-06 LAB — IGA: IgA: 176 mg/dL (ref 68–378)

## 2019-01-06 NOTE — Patient Instructions (Signed)
Your provider has requested that you go to the basement level for lab work before leaving today. Press "B" on the elevator. The lab is located at the first door on the left as you exit the elevator.  Stop taking your probiotic and mag-oxide.    Kegel Exercises  Kegel exercises can help strengthen your pelvic floor muscles. The pelvic floor is a group of muscles that support your rectum, small intestine, and bladder. In females, pelvic floor muscles also help support the womb (uterus). These muscles help you control the flow of urine and stool. Kegel exercises are painless and simple, and they do not require any equipment. Your provider may suggest Kegel exercises to:  Improve bladder and bowel control.  Improve sexual response.  Improve weak pelvic floor muscles after surgery to remove the uterus (hysterectomy) or pregnancy (females).  Improve weak pelvic floor muscles after prostate gland removal or surgery (males). Kegel exercises involve squeezing your pelvic floor muscles, which are the same muscles you squeeze when you try to stop the flow of urine or keep from passing gas. The exercises can be done while sitting, standing, or lying down, but it is best to vary your position. Exercises How to do Kegel exercises: 1. Squeeze your pelvic floor muscles tight. You should feel a tight lift in your rectal area. If you are a female, you should also feel a tightness in your vaginal area. Keep your stomach, buttocks, and legs relaxed. 2. Hold the muscles tight for up to 10 seconds. 3. Breathe normally. 4. Relax your muscles. 5. Repeat as told by your health care provider. Repeat this exercise daily as told by your health care provider. Continue to do this exercise for at least 4-6 weeks, or for as long as told by your health care provider. You may be referred to a physical therapist who can help you learn more about how to do Kegel exercises. Depending on your condition, your health care  provider may recommend:  Varying how long you squeeze your muscles.  Doing several sets of exercises every day.  Doing exercises for several weeks.  Making Kegel exercises a part of your regular exercise routine. This information is not intended to replace advice given to you by your health care provider. Make sure you discuss any questions you have with your health care provider. Document Released: 01/03/2012 Document Revised: 09/05/2017 Document Reviewed: 09/05/2017 Elsevier Patient Education  2020 [place] you for choosing me and [facility].  Pricilla Riffle. Dagoberto Ligas., MD., Marval Regal

## 2019-01-06 NOTE — Progress Notes (Signed)
History of Present Illness: This is a 85 year old female referred by Myrtis Hopping., MD for the evaluation of urgent episodic diarrhea and occasional fecal incontinence for 1 year. She is accompanied by her daughter who is a recently retired Engineer, drilling, Insurance account manager.  She was hospitalized at [facility] in May 2019 for an acute norovirus infection with nausea, vomiting, diarrhea and remained hospitalized for 5 - 6 days.  A few months later she developed problems with intermittent diarrhea with urgency and occasional incontinence.  Initially happening about once per month. It has increased in frequency and over the past 3 to 4 months it has been happening once or twice per week.  Her symptoms could be worse after spicy foods however symptoms occur at other times as well. There is no clear correlation to meals time of day diet or beverages that they can establish.  She has had no nocturnal diarrhea.  She takes a daily probiotic and magnesium supplement.  She was taking Colace up until several weeks ago and it was discontinued with no change in symptoms.  No prior colonoscopy. Denies weight loss, abdominal pain, constipation, change in stool caliber, melena, hematochezia, nausea, vomiting, dysphagia, reflux symptoms, chest pain.    Allergies  Allergen Reactions  . Darvon [Propoxyphene Hcl] Anaphylaxis  . Codeine Nausea And Vomiting  . Lisinopril Swelling    facial edema   Outpatient Medications Prior to Visit  Medication Sig Dispense Refill  . Ascorbic Acid (VITA-C PO) Take 500 mg by mouth daily.    Marland Kitchen aspirin 81 MG tablet Take 81 mg by mouth daily.    . chlorthalidone (HYGROTON) 25 MG tablet Take 12.5 mg by mouth daily.    . clopidogrel (PLAVIX) 75 MG tablet TAKE 1 TABLET BY MOUTH DAILY 90 tablet 4  . Ferrous Sulfate (IRON) 325 (65 Fe) MG TABS Take 1 each by mouth every other day.     Marland Kitchen FOLBEE 2.5-25-1 MG TABS tablet TAKE 1 TABLET BY MOUTH EVERY DAY 90 tablet 4  . folic  acid-pyridoxine-cyancobalamin (FOLBIC) 2.5-25-2 MG TABS tablet Take 1 tablet by mouth daily. 90 tablet 4  . lovastatin (MEVACOR) 40 MG tablet Take 40 mg by mouth at bedtime.    . magnesium oxide (MAG-OX) 400 MG tablet Take 400 mg by mouth daily.    . Multiple Vitamin (MULTIVITAMIN) capsule Take 1 capsule by mouth daily.    . Multiple Vitamins-Minerals (ZINC PO) Take 25 mg by mouth daily.    . potassium chloride (KLOR-CON) 20 MEQ packet Take by mouth daily.    . Probiotic Product (PROBIOTIC ADVANCED PO) Take by mouth daily.    . travoprost, benzalkonium, (TRAVATAN) 0.004 % ophthalmic solution Place 1 drop into both eyes at bedtime.     Marland Kitchen ZINC OXIDE PO Take by mouth daily.    . Calcium Carbonate-Vitamin D (CALCIUM + D PO) Take by mouth.     No facility-administered medications prior to visit.    Past Medical History:  Diagnosis Date  . Chronic kidney disease   . Diabetes mellitus without complication ([place])   . Endometrial cancer ([place])   . Glaucoma   . Heart murmur   . History of breast cancer   . Hypertension   . TIA (transient ischemic attack)    Past Surgical History:  Procedure Laterality Date  . ABDOMINAL HYSTERECTOMY  1970's  . CATARACT EXTRACTION  2008  . LAPAROSCOPIC CHOLECYSTECTOMY  2001  . MASTECTOMY  09/05/2006   left  .  SQUAMOUS CELL CARCINOMA EXCISION  2000   nose   Social History   Socioeconomic History  . Marital status: Married    Spouse name: Not on file  . Number of children: 1  . Years of education: Not on file  . Highest education level: Not on file  Occupational History  . Occupation: retired  Scientific laboratory technician  . Financial resource strain: Not on file  . Food insecurity    Worry: Not on file    Inability: Not on file  . Transportation needs    Medical: Not on file    Non-medical: Not on file  Tobacco Use  . Smoking status: Never Smoker  . Smokeless tobacco: Never Used  Substance and Sexual Activity  . Alcohol use: No  . Drug use: No  . Sexual  activity: Not on file  Lifestyle  . Physical activity    Days per week: Not on file    Minutes per session: Not on file  . Stress: Not on file  Relationships  . Social Herbalist on phone: Not on file    Gets together: Not on file    Attends religious service: Not on file    Active member of club or organization: Not on file    Attends meetings of clubs or organizations: Not on file    Relationship status: Not on file  Other Topics Concern  . Not on file  Social History Narrative  . Not on file   Family History  Problem Relation Age of Onset  . Heart disease Mother   . Stroke Father   . Heart disease Sister   . Diabetes Sister       Review of Systems: Pertinent positive and negative review of systems were noted in the above HPI section. All other review of systems were otherwise negative.    Physical Exam: General: Well developed, well nourished, no acute distress Head: Normocephalic and atraumatic Eyes:  sclerae anicteric, EOMI Ears: Normal auditory acuity Mouth: No deformity or lesions Neck: Supple, no masses or thyromegaly Lungs: Clear throughout to auscultation Heart: Regular rate and rhythm; no rubs or bruits, 2/6 systolic murmur Abdomen: Soft, mild LLQ tenderness and non distended. No masses, hepatosplenomegaly or hernias noted. Normal Bowel sounds Rectal: Decreased sphincter tone, decreased squeeze, heme negative stool, moderate volume of soft brown stool in the vault Musculoskeletal: Symmetrical with no gross deformities  Skin: No lesions on visible extremities Pulses:  Normal pulses noted Extremities: No clubbing, cyanosis, edema or deformities noted Neurological: Alert oriented x 4, grossly nonfocal Cervical Nodes:  No significant cervical adenopathy Inguinal Nodes: No significant inguinal adenopathy Psychological:  Alert and cooperative. Normal mood and affect   Assessment and Recommendations:  1.  Episodic, urgent diarrhea increasing in  frequency with frequent fecal incontinence. Decreased sphincter tone on DRE. R/O MagOx or probiotic side effect or iron. R/O food intolerance, infection, celiac disease, SIBO, microscopic colitis, overflow diarrhea.  Discontinue MagOx and probiotic. If not improved discontinue iron.  Begin Kegel exercises qid long term. LFTs, tTG, IgA, TSH, GI pathogen panel, fecal elastase.  Consider anorectal manometry, abdominal imaging and colonoscopy for further evaluation. ERV in 6 weeks.   2. Anemia, normocytic. Fe, TIBC, ferritin, B12, folate.  3. S/P cholecystectomy 2001.   4. CKD 3.   5. History of TIA maintained on Plavix.  6. History of breast cancer, S/P mastectomy 2008.    cc: Myrtis Hopping., MD [id number] [place],  [place] [address]

## 2019-01-07 ENCOUNTER — Encounter: Payer: Self-pay | Admitting: Gastroenterology

## 2019-01-07 ENCOUNTER — Other Ambulatory Visit: Payer: Medicare Other

## 2019-01-07 ENCOUNTER — Telehealth: Payer: Self-pay | Admitting: Gastroenterology

## 2019-01-07 DIAGNOSIS — R197 Diarrhea, unspecified: Secondary | ICD-10-CM

## 2019-01-07 DIAGNOSIS — R152 Fecal urgency: Secondary | ICD-10-CM

## 2019-01-07 DIAGNOSIS — R159 Full incontinence of feces: Secondary | ICD-10-CM

## 2019-01-07 LAB — TISSUE TRANSGLUTAMINASE, IGA: (tTG) Ab, IgA: 1 U/mL

## 2019-01-07 NOTE — Telephone Encounter (Signed)
Called daughter back and gave lab results

## 2019-01-13 LAB — GASTROINTESTINAL PATHOGEN PANEL PCR
C. difficile Tox A/B, PCR: NOT DETECTED
Campylobacter, PCR: NOT DETECTED
Cryptosporidium, PCR: NOT DETECTED
E coli (ETEC) LT/ST PCR: NOT DETECTED
E coli (STEC) stx1/stx2, PCR: NOT DETECTED
E coli 0157, PCR: NOT DETECTED
Giardia lamblia, PCR: NOT DETECTED
Norovirus, PCR: NOT DETECTED
Rotavirus A, PCR: NOT DETECTED
Salmonella, PCR: NOT DETECTED
Shigella, PCR: NOT DETECTED

## 2019-01-13 LAB — PANCREATIC ELASTASE, FECAL: Pancreatic Elastase-1, Stool: 235 mcg/g

## 2019-02-17 ENCOUNTER — Ambulatory Visit (INDEPENDENT_AMBULATORY_CARE_PROVIDER_SITE_OTHER): Payer: Medicare Other | Admitting: Gastroenterology

## 2019-02-17 ENCOUNTER — Encounter: Payer: Self-pay | Admitting: Gastroenterology

## 2019-02-17 VITALS — BP 144/56 | HR 88 | Temp 97.8°F | Ht 61.5 in | Wt 140.2 lb

## 2019-02-17 DIAGNOSIS — R197 Diarrhea, unspecified: Secondary | ICD-10-CM

## 2019-02-17 NOTE — Progress Notes (Signed)
33   History of Present Illness: This is a 86 year old female returning for follow-up of diarrhea.  She is accompanied by her daughter who is a recently retired Insurance account manager.  Blood work and stool study results reviewed with only findings of mild iron deficiency which had been previously noted.  After resolve after stopping Colace, MgOx and probiotic her diarrhea completely resolved.  Over the past week or 2 she has had mild constipation.  Previously she had taken Colace or Metamucil for mild constipation.  Current Medications, Allergies, Past Medical History, Past Surgical History, Family History and Social History were reviewed in [employer] record.   Physical Exam: General: Well developed, well nourished, no acute distress Head: Normocephalic and atraumatic Eyes:  sclerae anicteric, EOMI Ears: Normal auditory acuity Mouth: No deformity or lesions Lungs: Clear throughout to auscultation Heart: Regular rate and rhythm; no murmurs, rubs or bruits Abdomen: Soft, non tender and non distended. No masses, hepatosplenomegaly or hernias noted. Normal Bowel sounds Rectal: Not done Musculoskeletal: Symmetrical with no gross deformities  Pulses:  Normal pulses noted Extremities: No clubbing, cyanosis, edema or deformities noted Neurological: Alert oriented x 4, grossly nonfocal Psychological:  Alert and cooperative. Normal mood and affect   Assessment and Recommendations:  1.  Diarrhea due to Mag Ox, probiotic or less likely Colace.  Decreased sphincter tone on DRE.  Mild constipation has been noted after stopping above medications.  Kegel exercises qid long term.  Constipation management options options are to resume Colace qd to qod or Metamucil qd to qod or 1/4-1/2 Mag Ox tablet.  The patient and her daughter will decide.  GI follow up prn.    2. Iron deficiency anemia.  Continue ferrous sulfate 325 mg po qod as recommended by her PCP.  No further GI evaluation at  this time.  If iron deficiency persists or new gastrointestinal symptoms develop will reassess and reconsider colonoscopy and/or EGD. Follow up with PCP.   3. S/P cholecystectomy 2001.   4. CKD 3.   5. History of TIA maintained on Plavix.  6. History of breast cancer, S/P mastectomy 2008.

## 2019-02-17 NOTE — Patient Instructions (Addendum)
If you are age 84 or older, your body mass index should be between 23-30. Your Body mass index is 26.07 kg/m. If this is out of the aforementioned range listed, please consider follow up with your Primary Care Provider.  If you are age 72 or younger, your body mass index should be between 19-25. Your Body mass index is 26.07 kg/m. If this is out of the aformentioned range listed, please consider follow up with your Primary Care Provider.   You can use colace, magnesium, or Metamucil as needed for constipation.  Follow up with our office as needed.   Thank you

## 2019-09-03 ENCOUNTER — Other Ambulatory Visit: Payer: Self-pay | Admitting: Neurology

## 2019-09-23 ENCOUNTER — Ambulatory Visit: Payer: Self-pay | Admitting: Neurology

## 2019-11-04 ENCOUNTER — Other Ambulatory Visit: Payer: Self-pay | Admitting: Neurology

## 2019-11-04 ENCOUNTER — Other Ambulatory Visit: Payer: Self-pay | Admitting: *Deleted

## 2019-11-04 MED ORDER — FOLBEE 2.5-25-1 MG PO TABS: 1.0000 | ORAL_TABLET | Freq: Every day | ORAL | 0 refills | Status: DC

## 2019-12-31 ENCOUNTER — Ambulatory Visit (INDEPENDENT_AMBULATORY_CARE_PROVIDER_SITE_OTHER): Payer: Medicare Other | Admitting: Neurology

## 2019-12-31 ENCOUNTER — Encounter: Payer: Self-pay | Admitting: Neurology

## 2019-12-31 VITALS — BP 181/70 | HR 86 | Ht 61.5 in | Wt 136.5 lb

## 2019-12-31 DIAGNOSIS — G459 Transient cerebral ischemic attack, unspecified: Secondary | ICD-10-CM

## 2019-12-31 DIAGNOSIS — R269 Unspecified abnormalities of gait and mobility: Secondary | ICD-10-CM | POA: Diagnosis not present

## 2019-12-31 DIAGNOSIS — I10 Essential (primary) hypertension: Secondary | ICD-10-CM

## 2019-12-31 DIAGNOSIS — I6523 Occlusion and stenosis of bilateral carotid arteries: Secondary | ICD-10-CM

## 2019-12-31 DIAGNOSIS — R413 Other amnesia: Secondary | ICD-10-CM

## 2019-12-31 DIAGNOSIS — R7989 Other specified abnormal findings of blood chemistry: Secondary | ICD-10-CM

## 2019-12-31 NOTE — Progress Notes (Signed)
[facility]  PATIENT: Amber MCGONAGLE DOB: 06/04/1924  REFERRING DOCTOR OR PCP:  Dr. Rita Ohara SOURCE: Patient and records  _________________________________   HISTORICAL  CHIEF COMPLAINT:  Chief Complaint  Patient presents with  . Follow-up    RM 12 with daughter. Last seen 09/23/2018  . Hx TIA    On ASA, plavix    HISTORY OF PRESENT ILLNESS:  [name] is an 86 year old woman with a history of TIA and mor erecent memory loss  Update 12/31/2019: She denies any new TIA symptoms.   She is on clopidogrel and 81 mg aspirin.  No episodes of TIA-like weakness, numbness or speech difficulty.    She does note occasional numbness in a hand when she lays on her amrs but it improves with movements.     She has noted that she gets some tingling in the right arm when she urinates,  She had a low grade infection treated with 3 d ciprofloxacin and it no longer bothers her.   She notes her urine stream is sometimes slow to initiate but she does not need to strain.     She has a little more difficulty with word finding and numbers and she has had mild short term memory issues.   This has worsened over the last year.  TIAs: She had a TIA 2007 associated with reduced speech. Symptoms lasted 3-5 minutes and Doppler study showed 40-59% stenosis bilaterally. In 03/24/2015, she had a TIA/RIND.      She has several CVA risk factors including hypercholesterolemia, HTN and pre-diabetes.   Also, she has bilateral carotid stenosis.     She has elevated homocysteine and is on [place].    REVIEW OF SYSTEMS: Constitutional: No fevers, chills, sweats, or change in appetite Eyes: No visual changes, double vision, eye pain Ear, nose and throat: No hearing loss, ear pain, nasal congestion, sore throat Cardiovascular: No chest pain, palpitations Respiratory: No shortness of breath at rest or with exertion.   No wheezes GastrointestinaI: No nausea, vomiting, diarrhea, abdominal pain,  fecal incontinence Genitourinary: She has mild frequency and some nocturia. Musculoskeletal: Some neck pain > back pain Integumentary: No rash, pruritus, skin lesions Neurological: as above Psychiatric: No depression at this time.  No anxiety Endocrine: No palpitations, diaphoresis, change in appetite, change in weigh or increased thirst Hematologic/Lymphatic: No anemia, purpura, petechiae. Allergic/Immunologic: No itchy/runny eyes, nasal congestion, recent allergic reactions, rashes  ALLERGIES: Allergies  Allergen Reactions  . Darvon [Propoxyphene Hcl] Anaphylaxis  . Codeine Nausea And Vomiting  . Lisinopril Swelling    facial edema    HOME MEDICATIONS:  Current Outpatient Medications:  .  Ascorbic Acid (VITA-C PO), Take 500 mg by mouth daily., Disp: , Rfl:  .  aspirin 81 MG tablet, Take 81 mg by mouth daily., Disp: , Rfl:  .  chlorthalidone (HYGROTON) 25 MG tablet, Take 12.5 mg by mouth daily., Disp: , Rfl:  .  clopidogrel (PLAVIX) 75 MG tablet, TAKE 1 TABLET BY MOUTH EVERY DAY, Disp: 90 tablet, Rfl: 3 .  Ferrous Sulfate (IRON) 325 (65 Fe) MG TABS, Take 1 each by mouth every other day. , Disp: , Rfl:  .  Folic Acid-Vit V7-OHY W73 (FOLBEE) 2.5-25-1 MG TABS tablet, Take 1 tablet by mouth daily., Disp: 90 tablet, Rfl: 0 .  lovastatin (MEVACOR) 40 MG tablet, Take 40 mg by mouth at bedtime., Disp: , Rfl:  .  Multiple Vitamin (MULTIVITAMIN) capsule, Take 1 capsule by mouth daily., Disp: , Rfl:  .  Multiple Vitamins-Minerals (ZINC PO), Take 25 mg by mouth daily. Zinc, Disp: , Rfl:  .  potassium chloride (KLOR-CON) 20 MEQ packet, Take by mouth daily., Disp: , Rfl:  .  travoprost, benzalkonium, (TRAVATAN) 0.004 % ophthalmic solution, Place 1 drop into both eyes at bedtime. , Disp: , Rfl:  .  VITAMIN D PO, Take 1,000 Units by mouth daily., Disp: , Rfl:  .  ZINC OXIDE PO, Take by mouth daily., Disp: , Rfl:   PAST MEDICAL HISTORY: Past Medical History:  Diagnosis Date  . Chronic  kidney disease   . Diabetes mellitus without complication ([place])   . Endometrial cancer ([place])   . Glaucoma   . Heart murmur   . History of breast cancer   . Hypertension   . TIA (transient ischemic attack)     PAST SURGICAL HISTORY: Past Surgical History:  Procedure Laterality Date  . ABDOMINAL HYSTERECTOMY  1970's  . CATARACT EXTRACTION  2008  . LAPAROSCOPIC CHOLECYSTECTOMY  2001  . MASTECTOMY  09/05/2006   left  . SQUAMOUS CELL CARCINOMA EXCISION  2000   nose    FAMILY HISTORY: Family History  Problem Relation Age of Onset  . Heart disease Mother   . Stroke Father   . Heart disease Sister   . Diabetes Sister     SOCIAL HISTORY:  Social History   Socioeconomic History  . Marital status: Married    Spouse name: Not on file  . Number of children: 1  . Years of education: Not on file  . Highest education level: Not on file  Occupational History  . Occupation: retired  Tobacco Use  . Smoking status: Never Smoker  . Smokeless tobacco: Never Used  Vaping Use  . Vaping Use: Never used  Substance and Sexual Activity  . Alcohol use: No  . Drug use: No  . Sexual activity: Not on file  Other Topics Concern  . Not on file  Social History Narrative  . Not on file   Social Determinants of Health   Financial Resource Strain:   . Difficulty of Paying Living Expenses: Not on file  Food Insecurity:   . Worried About Charity fundraiser in the Last Year: Not on file  . Ran Out of Food in the Last Year: Not on file  Transportation Needs:   . Lack of Transportation (Medical): Not on file  . Lack of Transportation (Non-Medical): Not on file  Physical Activity:   . Days of Exercise per Week: Not on file  . Minutes of Exercise per Session: Not on file  Stress:   . Feeling of Stress : Not on file  Social Connections:   . Frequency of Communication with Friends and Family: Not on file  . Frequency of Social Gatherings with Friends and Family: Not on file  . Attends  Religious Services: Not on file  . Active Member of Clubs or Organizations: Not on file  . Attends Archivist Meetings: Not on file  . Marital Status: Not on file  Intimate Partner Violence:   . Fear of Current or Ex-Partner: Not on file  . Emotionally Abused: Not on file  . Physically Abused: Not on file  . Sexually Abused: Not on file     PHYSICAL EXAM  Vitals:   12/31/19 1532  BP: (!) 181/70  Pulse: 86  SpO2: 98%  Weight: 136 lb 8 oz (61.9 kg)  Height: 5' 1.5" (1.562 m)    Body mass index is 25.37  kg/m.    General: The patient is well-developed and well-nourished and in no acute distress  Neck/CV:   She has carotid bruits bilaterally, louder on the left.  The heart has a regular rate and rhythm with normal sounds  Neurologic Exam  Mental status: The patient is alert and oriented x 3 at the time of the examination. The patient has apparent normal recent and remote memory (3/3), with an apparently normal attention span and concentration ability (100-93-92?); [id number]).   Numbers of clock slightly misaligned and could not do hands.   Continued geometric pattern well.  Speech is normal.  Cranial nerves: Extraocular movements are full.  Facial strength and sensation was normal.  The trapezius strength was normal.  There is no dysarthria.  The tongue is midline, and the patient has symmetric elevation of the soft palate. No obvious hearing deficits are noted.  Motor:  Muscle bulk is normal.   Tone is normal. Strength is  5 / 5 in all 4 extremities.   Sensory: Sensory testing is intact to touch and vibration in the arms.  Coordination: Cerebellar testing reveals good finger-nose-finger and heel-to-shin bilaterally.  Gait and station: Station is normal.   The gait has a mildly reduced stride and she takes 4 steps to turn-likely normal for age.   She has mild retropulsion..  The tandem is poor.  The Romberg is negative.  Reflexes: Deep tendon reflexes are brisk  at the knees with spreadl bilaterally.       DIAGNOSTIC DATA (LABS, IMAGING, TESTING) - I reviewed patient records, labs, notes, testing and imaging myself where available.  Lab Results  Component Value Date   WBC 8.2 09/03/2006   HGB 12.2 09/03/2006   HCT 36.0 09/03/2006   MCV 91.5 09/03/2006   PLT 347 09/03/2006      Component Value Date/Time   NA 135 09/03/2006 1110   K 3.8 09/03/2006 1110   CL 94 (L) 09/03/2006 1110   CO2 30 09/03/2006 1110   GLUCOSE 99 09/03/2006 1110   BUN 19 09/03/2006 1110   CREATININE 1.20 09/03/2006 1110   CALCIUM 10.7 (H) 09/03/2006 1110   PROT 7.4 01/06/2019 1450   ALBUMIN 4.4 01/06/2019 1450   AST 15 01/06/2019 1450   ALT 7 01/06/2019 1450   ALKPHOS 41 01/06/2019 1450   BILITOT 0.4 01/06/2019 1450   GFRNONAA 43 (L) 09/03/2006 1110   GFRAA (L) 09/03/2006 1110    52        The eGFR has been calculated using the MDRD equation. This calculation has not been validated in all clinical       ASSESSMENT AND PLAN    1. TIA (transient ischemic attack)   2. Gait disturbance   3. Bilateral carotid artery stenosis   4. Benign hypertension   5. Memory loss   6. Elevated homocysteine     1.   Continue aspirin and Plavix for history of TIA.    2.   Homocysteine was elevated in the past.   continue Folbee 3.   Stay active and exercises as tolerated. 4.   Due to worsening cognitive issues, we will check MRI brain and compare to 2017.    consider donepezil if memory worsens or if atrophy is worse.   5.  Return in 1 year. She or her daughter are advised to call me if she has any new or worsening neurologic symptoms.  45-minute office visit with the majority of the time spent face-to-face for history and physical,  discussion/counseling and decision-making.  Additional time with record review and documentation.     Deshanna Kama A. Felecia Shelling, MD, PhD 88/07/5795, 2:82 PM Certified in Neurology, Clinical Neurophysiology, Sleep Medicine, Pain Medicine and  Neuroimaging  [facility] [address], [place], [place] [address] [phone]

## 2020-01-01 ENCOUNTER — Telehealth: Payer: Self-pay | Admitting: Neurology

## 2020-01-01 NOTE — Telephone Encounter (Signed)
Medicare/aarp order sent to [facility]. No auth they will reach out to the patient to schedule.  

## 2020-01-05 ENCOUNTER — Other Ambulatory Visit: Payer: Self-pay | Admitting: Neurology

## 2020-01-06 ENCOUNTER — Telehealth: Payer: Self-pay | Admitting: Neurology

## 2020-01-06 ENCOUNTER — Telehealth: Payer: Self-pay

## 2020-01-06 NOTE — Telephone Encounter (Signed)
FYI

## 2020-01-06 NOTE — Telephone Encounter (Signed)
FYI: [facility] [employer]) called, daughter called and cancelled MRI examine.

## 2020-01-06 NOTE — Telephone Encounter (Signed)
Pt has decided that she isn't interested in taking the medication he mentioned at last weeks visit or even performing the MRI. If you need to call the daughter in regards to this, she will be waiting for it.

## 2020-01-13 ENCOUNTER — Other Ambulatory Visit: Payer: Medicare Other

## 2020-10-08 ENCOUNTER — Other Ambulatory Visit: Payer: Self-pay | Admitting: Neurology

## 2020-12-30 ENCOUNTER — Ambulatory Visit: Payer: Medicare Other | Admitting: Neurology

## 2021-01-31 ENCOUNTER — Other Ambulatory Visit: Payer: Self-pay

## 2021-01-31 ENCOUNTER — Encounter (HOSPITAL_BASED_OUTPATIENT_CLINIC_OR_DEPARTMENT_OTHER): Payer: Self-pay

## 2021-01-31 ENCOUNTER — Emergency Department (HOSPITAL_BASED_OUTPATIENT_CLINIC_OR_DEPARTMENT_OTHER): Payer: Medicare Other

## 2021-01-31 ENCOUNTER — Emergency Department (HOSPITAL_BASED_OUTPATIENT_CLINIC_OR_DEPARTMENT_OTHER)
Admission: EM | Admit: 2021-01-31 | Discharge: 2021-01-31 | Disposition: A | Discharge status: Home / Self Care | Payer: Medicare Other | Attending: Emergency Medicine | Admitting: Emergency Medicine

## 2021-01-31 DIAGNOSIS — E860 Dehydration: Secondary | ICD-10-CM

## 2021-01-31 DIAGNOSIS — R531 Weakness: Secondary | ICD-10-CM

## 2021-01-31 DIAGNOSIS — Z7902 Long term (current) use of antithrombotics/antiplatelets: Secondary | ICD-10-CM | POA: Insufficient documentation

## 2021-01-31 DIAGNOSIS — N390 Urinary tract infection, site not specified: Secondary | ICD-10-CM

## 2021-01-31 DIAGNOSIS — M545 Low back pain, unspecified: Secondary | ICD-10-CM | POA: Diagnosis not present

## 2021-01-31 DIAGNOSIS — U071 COVID-19: Secondary | ICD-10-CM | POA: Diagnosis not present

## 2021-01-31 DIAGNOSIS — Z7982 Long term (current) use of aspirin: Secondary | ICD-10-CM | POA: Diagnosis not present

## 2021-01-31 LAB — CBC WITH DIFFERENTIAL/PLATELET
Abs Immature Granulocytes: 0.02 10*3/uL (ref 0.00–0.07)
Basophils Absolute: 0 10*3/uL (ref 0.0–0.1)
Basophils Relative: 0 %
Eosinophils Absolute: 0 10*3/uL (ref 0.0–0.5)
Eosinophils Relative: 1 %
HCT: 33.9 % — ABNORMAL LOW (ref 36.0–46.0)
Hemoglobin: 11.6 g/dL — ABNORMAL LOW (ref 12.0–15.0)
Immature Granulocytes: 0 %
Lymphocytes Relative: 26 %
Lymphs Abs: 1.3 10*3/uL (ref 0.7–4.0)
MCH: 30.9 pg (ref 26.0–34.0)
MCHC: 34.2 g/dL (ref 30.0–36.0)
MCV: 90.4 fL (ref 80.0–100.0)
Monocytes Absolute: 0.5 10*3/uL (ref 0.1–1.0)
Monocytes Relative: 10 %
Neutro Abs: 3.2 10*3/uL (ref 1.7–7.7)
Neutrophils Relative %: 63 %
Platelets: 270 10*3/uL (ref 150–400)
RBC: 3.75 MIL/uL — ABNORMAL LOW (ref 3.87–5.11)
RDW: 12.9 % (ref 11.5–15.5)
WBC: 5 10*3/uL (ref 4.0–10.5)
nRBC: 0 % (ref 0.0–0.2)

## 2021-01-31 LAB — URINALYSIS, ROUTINE W REFLEX MICROSCOPIC
Glucose, UA: NEGATIVE mg/dL
Hgb urine dipstick: NEGATIVE
Ketones, ur: 15 mg/dL — AB
Nitrite: NEGATIVE
Protein, ur: 100 mg/dL — AB
Specific Gravity, Urine: 1.02 (ref 1.005–1.030)
pH: 5.5 (ref 5.0–8.0)

## 2021-01-31 LAB — URINALYSIS, MICROSCOPIC (REFLEX)

## 2021-01-31 LAB — BASIC METABOLIC PANEL
Anion gap: 13 (ref 5–15)
BUN: 49 mg/dL — ABNORMAL HIGH (ref 8–23)
CO2: 20 mmol/L — ABNORMAL LOW (ref 22–32)
Calcium: 9.3 mg/dL (ref 8.9–10.3)
Chloride: 106 mmol/L (ref 98–111)
Creatinine, Ser: 1.65 mg/dL — ABNORMAL HIGH (ref 0.44–1.00)
GFR, Estimated: 29 mL/min — ABNORMAL LOW (ref >60)
Glucose, Bld: 103 mg/dL — ABNORMAL HIGH (ref 70–99)
Potassium: 4 mmol/L (ref 3.5–5.1)
Sodium: 139 mmol/L (ref 135–145)

## 2021-01-31 LAB — RESP PANEL BY RT-PCR (FLU A&B, COVID) ARPGX2
Influenza A by PCR: NEGATIVE
Influenza B by PCR: NEGATIVE
SARS Coronavirus 2 by RT PCR: POSITIVE — AB

## 2021-01-31 MED ORDER — CEPHALEXIN 500 MG PO CAPS: 500.0000 mg | ORAL_CAPSULE | Freq: Three times a day (TID) | ORAL | 0 refills | Status: DC

## 2021-01-31 MED ORDER — CEFTRIAXONE SODIUM 1 G IJ SOLR
1.0000 g | Freq: Once | INTRAMUSCULAR | Status: AC
  Administered 2021-01-31: 1 g via INTRAVENOUS
  Filled 2021-01-31: qty 10

## 2021-01-31 MED ORDER — SODIUM CHLORIDE 0.9 % IV BOLUS
1000.0000 mL | Freq: Once | INTRAVENOUS | Status: AC
  Administered 2021-01-31: 1000 mL via INTRAVENOUS

## 2021-01-31 NOTE — ED Provider Notes (Addendum)
[facility] EMERGENCY DEPARTMENT Provider Note   CSN: [id number] Arrival date & time: 01/31/21  1105     History  Chief Complaint  Patient presents with   Weakness    [name] is a 88 y.o. female.  Patient c/o generally feeling weak in past week, with decreased po intake, ?dysuria/uti. Symptoms acute onset, moderate, persistent. No focal or unilateral weakness. No change in speech or vision. No fevers. No cough, sore throat, or uri symptoms. No chest pain or discomfort. No sob or unusual doe or fatigue. No abd pain or vomiting/diarrhea. No change in stools, no black or tarry looking stools. No specific known ill contacts. No change in meds. Patient also c/o low back pain. Long standing hx kyphosis, no prior compression fxs, no hx chronic back pain. No saddle area or leg numbness. No leg weakness. No new problems w bowel or bladder control. There was a fall last month, but no recent fall.   The history is provided by the patient, medical records and a relative.  Weakness Associated symptoms: no abdominal pain, no chest pain, no cough, no fever, no headaches, no shortness of breath and no vomiting       Home Medications Prior to Admission medications   Medication Sig Start Date End Date Taking? Authorizing Provider  Ascorbic Acid (VITA-C PO) Take 500 mg by mouth daily.    [provider]  aspirin 81 MG tablet Take 81 mg by mouth daily.    [provider]  chlorthalidone (HYGROTON) 25 MG tablet Take 12.5 mg by mouth daily.    [provider]  clopidogrel (PLAVIX) 75 MG tablet TAKE 1 TABLET BY MOUTH EVERY DAY 10/11/20   Sater, Nanine Means, MD  Ferrous Sulfate (IRON) 325 (65 Fe) MG TABS Take 1 each by mouth every other day.     [provider]  Folic Acid-Vit W9-VXY I01 (FOLBEE) 2.5-25-1 MG TABS tablet Take 1 tablet by mouth daily. 11/04/19   Sater, Nanine Means, MD  lovastatin (MEVACOR) 40 MG tablet Take 40 mg by mouth at bedtime.    [provider]  Multiple Vitamin (MULTIVITAMIN) capsule Take 1 capsule by mouth daily.    [provider]  Multiple Vitamins-Minerals (ZINC PO) Take 25 mg by mouth daily. Zinc    [provider]  potassium chloride (KLOR-CON) 20 MEQ packet Take by mouth daily.    [provider]  travoprost, benzalkonium, (TRAVATAN) 0.004 % ophthalmic solution Place 1 drop into both eyes at bedtime.     [provider]  VITAMIN D PO Take 1,000 Units by mouth daily.    [provider]  ZINC OXIDE PO Take by mouth daily.    [provider]      Allergies    Darvon [propoxyphene hcl], Codeine, and Lisinopril    Review of Systems   Review of Systems  Constitutional:  Negative for fever.  HENT:  Negative for sore throat.   Eyes:  Negative for redness.  Respiratory:  Negative for cough and shortness of breath.   Cardiovascular:  Negative for chest pain.  Gastrointestinal:  Negative for abdominal pain and vomiting.  Genitourinary:  Negative for flank pain.  Musculoskeletal:  Positive for back pain. Negative for neck pain.  Skin:  Negative for rash.  Neurological:  Positive for weakness. Negative for speech difficulty and headaches.  Hematological:  Does not bruise/bleed easily.  Psychiatric/Behavioral:  Negative for agitation.    Physical Exam Updated Vital Signs BP Marland Kitchen)  205/49    Pulse 65    Temp 97.9 F (36.6 C) (Oral)    Resp 14    Ht 1.562 m (5' 1.5")    Wt 59 kg    SpO2 100%    BMI 24.17 kg/m  Physical Exam Vitals and nursing note reviewed.  Constitutional:      Appearance: Normal appearance. She is well-developed.  HENT:     Head: Atraumatic.     Nose: Nose normal.     Mouth/Throat:     Mouth: Mucous membranes are moist.  Eyes:     General: No scleral icterus.    Conjunctiva/sclera: Conjunctivae normal.     Pupils: Pupils are equal, round, and reactive to light.  Neck:     Vascular: No carotid bruit.     Trachea: No tracheal deviation.      Comments: No stiffness or rigidity.  Cardiovascular:     Rate and Rhythm: Normal rate and regular rhythm.     Pulses: Normal pulses.     Heart sounds: Normal heart sounds. No murmur heard.   No friction rub. No gallop.  Pulmonary:     Effort: Pulmonary effort is normal. No respiratory distress.     Breath sounds: Normal breath sounds.  Abdominal:     General: Bowel sounds are normal. There is no distension.     Palpations: Abdomen is soft. There is no mass.     Tenderness: There is no abdominal tenderness. There is no guarding.  Genitourinary:    Comments: No cva tenderness.  Musculoskeletal:        General: No swelling.     Cervical back: Normal range of motion and neck supple. No rigidity. No muscular tenderness.     Comments: Mild lumbar tenderness, otherwise CTLS spine, non tender, aligned, no step off. Good movement bilateral ext without pain or focal bony tenderness.   Skin:    General: Skin is warm and dry.     Findings: No rash.  Neurological:     Mental Status: She is alert.     Comments: Alert, speech normal. Motor/sens grossly intact.   Psychiatric:        Mood and Affect: Mood normal.    ED Results / Procedures / Treatments   Labs (all labs ordered are listed, but only abnormal results are displayed) Results for orders placed or performed during the hospital encounter of 01/31/21  Resp Panel by RT-PCR (Flu A&B, Covid) Urine, Clean Catch   Specimen: Urine, Clean Catch; Nasopharyngeal(NP) swabs in vial transport medium  Result Value Ref Range   SARS Coronavirus 2 by RT PCR POSITIVE (A) NEGATIVE   Influenza A by PCR NEGATIVE NEGATIVE   Influenza B by PCR NEGATIVE NEGATIVE  CBC with Differential  Result Value Ref Range   WBC 5.0 4.0 - 10.5 K/uL   RBC 3.75 (L) 3.87 - 5.11 MIL/uL   Hemoglobin 11.6 (L) 12.0 - 15.0 g/dL   HCT 33.9 (L) 36.0 - 46.0 %   MCV 90.4 80.0 - 100.0 fL   MCH 30.9 26.0 - 34.0 pg   MCHC 34.2 30.0 - 36.0 g/dL   RDW 12.9 11.5 - 15.5 %    Platelets 270 150 - 400 K/uL   nRBC 0.0 0.0 - 0.2 %   Neutrophils Relative % 63 %   Neutro Abs 3.2 1.7 - 7.7 K/uL   Lymphocytes Relative 26 %   Lymphs Abs 1.3 0.7 - 4.0 K/uL   Monocytes Relative 10 %  Monocytes Absolute 0.5 0.1 - 1.0 K/uL   Eosinophils Relative 1 %   Eosinophils Absolute 0.0 0.0 - 0.5 K/uL   Basophils Relative 0 %   Basophils Absolute 0.0 0.0 - 0.1 K/uL   Immature Granulocytes 0 %   Abs Immature Granulocytes 0.02 0.00 - 0.07 K/uL  Basic metabolic panel  Result Value Ref Range   Sodium 139 135 - 145 mmol/L   Potassium 4.0 3.5 - 5.1 mmol/L   Chloride 106 98 - 111 mmol/L   CO2 20 (L) 22 - 32 mmol/L   Glucose, Bld 103 (H) 70 - 99 mg/dL   BUN 49 (H) 8 - 23 mg/dL   Creatinine, Ser 1.65 (H) 0.44 - 1.00 mg/dL   Calcium 9.3 8.9 - 10.3 mg/dL   GFR, Estimated 29 (L) >60 mL/min   Anion gap 13 5 - 15  Urinalysis, Routine w reflex microscopic Urine, Clean Catch  Result Value Ref Range   Color, Urine YELLOW YELLOW   APPearance CLEAR CLEAR   Specific Gravity, Urine 1.020 1.005 - 1.030   pH 5.5 5.0 - 8.0   Glucose, UA NEGATIVE NEGATIVE mg/dL   Hgb urine dipstick NEGATIVE NEGATIVE   Bilirubin Urine SMALL (A) NEGATIVE   Ketones, ur 15 (A) NEGATIVE mg/dL   Protein, ur 100 (A) NEGATIVE mg/dL   Nitrite NEGATIVE NEGATIVE   Leukocytes,Ua MODERATE (A) NEGATIVE  Urinalysis, Microscopic (reflex)  Result Value Ref Range   RBC / HPF 0-5 0 - 5 RBC/hpf   WBC, UA 11-20 0 - 5 WBC/hpf   Bacteria, UA RARE (A) NONE SEEN   Squamous Epithelial / LPF 0-5 0 - 5   Urine-Other LESS THAN 10 mL OF URINE SUBMITTED       EKG EKG Interpretation  Date/Time:  Monday January 31 2021 13:50:55 EST Ventricular Rate:  63 PR Interval:  226 QRS Duration: 98 QT Interval:  431 QTC Calculation: 442 R Axis:   75 Text Interpretation: Sinus rhythm Prolonged PR interval Nonspecific T wave abnormality Confirmed by Lajean Saver [phone]) on 01/31/2021 1:53:09 PM  Radiology DG Lumbar Spine  Complete  Result Date: 01/31/2021 CLINICAL DATA:  Increasing weakness over the past 810 days. Confusion. Bilateral flank pain. EXAM: LUMBAR SPINE - COMPLETE 4+ VIEW COMPARISON:  None. FINDINGS: No fracture or bone lesion.  No spondylolisthesis. Moderate loss of disc height at L2-L3 L3-L4. Moderate to marked loss of disc height at L4-L5 and L5-S1. Mild facet degenerative changes in the lower lumbar spine. Dense aortic atherosclerotic calcifications. IMPRESSION: 1. No fracture or acute finding. 2. Degenerative changes as detailed. Electronically Signed   By: Lajean Manes M.D.   On: 01/31/2021 14:42    Procedures Procedures    Medications Ordered in ED Medications  sodium chloride 0.9 % bolus 1,000 mL (has no administration in time range)  cefTRIAXone (ROCEPHIN) 1 g in sodium chloride 0.9 % 100 mL IVPB (has no administration in time range)    ED Course/ Medical Decision Making/ A&P                           Medical Decision Making  Labs sent.   Ns bolus iv.   Reviewed nursing notes and prior charts for additional history. Outside records reviewed. Additional hx from daughter.   Labs reviewed/interpreted by me - wbc normal. Bun/cr sl increased from prior - ?dehydration. Ivf bolus. Po fluids.   Xrays reviewed/interpreted by me - no fx.   Additional labs reviewed/interpreted  by me - ua w possible uti, culture sent. Iv rocephin given.   Po fluids/food.   Overall, pt is non-toxic appearing. Abd soft nt. Tolerating po.   Additional labs reviewed/interpreted by me - covid test is positive.  Discussed w pt and daughter. Daughter/pt indicate had resp symptoms/uri ~ 3 weeks ago, but currently no cough, congestion or sob. I offered oral covid med tx (as not vaccinated, elderly) - pt/daughter decline oral therapy for covid.   Pt is breathing comfortably, no cough pulse ox 100%   Pt currently appears stable for d/c.   Rec close pcp f/u.  Return precautions provided.            Final Clinical Impression(s) / ED Diagnoses Final diagnoses:  None    Rx / DC Orders ED Discharge Orders     None          Lajean Saver, MD 01/31/21 1556

## 2021-01-31 NOTE — ED Notes (Signed)
Client is alert and oriented, GCS 15, capillary refill WNL, 2+ radial pulses, color WNL, skin warm and dry.

## 2021-01-31 NOTE — ED Notes (Signed)
Covid/ Flu Swab obtained. Safety measures in place. Family in room

## 2021-01-31 NOTE — ED Notes (Signed)
Pt given peanut butter crackers as well as teddy grahams. Pt has been tolerating PO fluids well.

## 2021-01-31 NOTE — ED Triage Notes (Signed)
Pt daughter states has been increasingly weak over the past 8-10 days, with confusion. C/ bilateral flank pain.

## 2021-01-31 NOTE — Discharge Instructions (Addendum)
It was our pleasure to provide your ER care today - we hope that you feel better.  Drink plenty of fluids/stay well hydrated.   Your lab tests show a possible urine infection - take antibiotic as prescribed.   Your covid test is also positive - see attached info.   Follow up with primary care doctor in one week if symptoms fail to improve/resolve. Also, follow up with your doctor regarding your blood pressure which is high today.   Return to ER if worse, new symptoms, high fevers, new, worsening or severe pain, persistent vomiting, trouble breathing, chest pain, weak/fainting, or other concern.

## 2021-02-01 LAB — URINE CULTURE: Culture: NO GROWTH

## 2021-02-08 ENCOUNTER — Telehealth: Payer: Self-pay | Admitting: Neurology

## 2021-02-08 NOTE — Telephone Encounter (Signed)
Called the [phone] phone number that was listed as the incoming call. There was no answer LVM asking for call back. Attempted the phone number on file for the daughter as well that is listed on DPR with no answer.  ** If the daughter calls back, please advise that we received her message and that we will complete a memory testing at the upcoming appointment 03/03/21 to get a baseline.

## 2021-02-08 NOTE — Telephone Encounter (Signed)
Pt's daughter Leroy Kennedy called states she is wanting to give a heads up to Dr. Felecia Shelling before her mothers upcoming appt, that her mother's cognitive ability is declining. Pt's daughter is wanting to pursue the test that we mentioned when they were here the last time. Pt's daughter is requesting a call back

## 2021-03-03 ENCOUNTER — Ambulatory Visit (INDEPENDENT_AMBULATORY_CARE_PROVIDER_SITE_OTHER): Payer: Medicare Other | Admitting: Neurology

## 2021-03-03 ENCOUNTER — Encounter: Payer: Self-pay | Admitting: Neurology

## 2021-03-03 VITALS — BP 108/66 | HR 66 | Ht 61.5 in | Wt 124.0 lb

## 2021-03-03 DIAGNOSIS — R413 Other amnesia: Secondary | ICD-10-CM

## 2021-03-03 DIAGNOSIS — R269 Unspecified abnormalities of gait and mobility: Secondary | ICD-10-CM

## 2021-03-03 DIAGNOSIS — I6523 Occlusion and stenosis of bilateral carotid arteries: Secondary | ICD-10-CM

## 2021-03-03 DIAGNOSIS — G459 Transient cerebral ischemic attack, unspecified: Secondary | ICD-10-CM

## 2021-03-03 MED ORDER — DONEPEZIL HCL 5 MG PO TABS: 5.0000 mg | ORAL_TABLET | Freq: Every day | ORAL | 1 refills | Status: DC

## 2021-03-03 NOTE — Progress Notes (Signed)
[facility]  PATIENT: [name] DOB: March 14, 1924  REFERRING DOCTOR OR PCP:  Dr. Rita Ohara SOURCE: Patient and records  _________________________________   HISTORICAL  CHIEF COMPLAINT:  Chief Complaint  Patient presents with   Follow-up    Rm 1, w daughter. Here for yearly f/u. Pt reports having memory concerns. Pt has moment of confusion and increasing cognitive issues. More noticeable last 8 months but worsened last 6 weeks. MMSE:18    HISTORY OF PRESENT ILLNESS:  [name] is an 88 year old woman with a history of TIA and mor erecent memory loss  Update 03/03/2021: She has had more difficulty with STM over the last year.   She also forgets to eat and does not have much appetite.    She has been more forgetful with medications.  Concept of time and number concepts are more difficult.     She has had some auditory hallucinations (like hearing daughter or others when they are not there).    Physically, she feels she is walking slower and balance is off but does not need support.    She last drove a week ago but daughte has taken keys as she has several incidents..   She had difficulty finding her car in a parking lot and another time she couldn't figure how to open the car door (car was still in drive).   She has had other episodes of confusion that are concerning.   She has tried to look for her daughter or granddaughter in her yard when they do not live with her.        She has had some personality changes.    She accused her daughter of turning friends against her (then had no memory of having accused).    She wore the same clothes 10 days in a row and needs reminders to do some self care.   (Had not showered x 2 weeks).   Socially she interacts well with others most of the time.     She got upset as we talked about above issues.     She sometimes slurs her speech after she eats a meal but this does not last.    She sometimes feels down and she cries  easily and sometimes without a good reason.   She feels she doesn't have a purpose anymore.   Her cat helps her mood.   She sleeps well.     She snores but never is sleepy and does not take naps.    B12, TSH, CBC and CMP were fine 02/23/2021 ecept   MMSE - Mini Mental State Exam 03/03/2021  Orientation to time 1  Orientation to Place 2  Registration 3  Attention/ Calculation 5  Recall 1  Language- name 2 objects 2  Language- repeat 1  Language- follow 3 step command 1  Language- read & follow direction 1  Write a sentence 1  Copy design 0  Total score 18     She denies any new TIA symptoms.   She is on clopidogrel and 81 mg aspirin.  No episodes of TIA-like weakness, numbness or speech difficulty.    She does note occasional numbness in a hand when she lays on her amrs but it improves with movements.     TIAs: She had a TIA 2007 associated with reduced speech. Symptoms lasted 3-5 minutes and Doppler study showed 40-59% stenosis bilaterally. In 03/24/2015, she had a TIA/RIND.      She has several CVA  risk factors including hypercholesterolemia, HTN and pre-diabetes.   Also, she has bilateral carotid stenosis.     She has elevated homocysteine and is on [place].    REVIEW OF SYSTEMS: Constitutional: No fevers, chills, sweats, or change in appetite Eyes: No visual changes, double vision, eye pain Ear, nose and throat: No hearing loss, ear pain, nasal congestion, sore throat Cardiovascular: No chest pain, palpitations Respiratory:  No shortness of breath at rest or with exertion.   No wheezes GastrointestinaI: No nausea, vomiting, diarrhea, abdominal pain, fecal incontinence Genitourinary:  She has mild frequency and some nocturia. Musculoskeletal:  Some neck pain > back pain Integumentary: No rash, pruritus, skin lesions Neurological: as above Psychiatric: No depression at this time.  No anxiety Endocrine: No palpitations, diaphoresis, change in appetite, change in weigh or increased  thirst Hematologic/Lymphatic:  No anemia, purpura, petechiae. Allergic/Immunologic: No itchy/runny eyes, nasal congestion, recent allergic reactions, rashes  ALLERGIES: Allergies  Allergen Reactions   Darvon [Propoxyphene Hcl] Anaphylaxis   Codeine Nausea And Vomiting   Lisinopril Swelling    facial edema    HOME MEDICATIONS:  Current Outpatient Medications:    Ascorbic Acid (VITA-C PO), Take 500 mg by mouth daily., Disp: , Rfl:    aspirin 81 MG tablet, Take 81 mg by mouth daily., Disp: , Rfl:    chlorthalidone (HYGROTON) 25 MG tablet, Take 12.5 mg by mouth daily., Disp: , Rfl:    clopidogrel (PLAVIX) 75 MG tablet, TAKE 1 TABLET BY MOUTH EVERY DAY, Disp: 90 tablet, Rfl: 3   donepezil (ARICEPT) 5 MG tablet, Take 1 tablet (5 mg total) by mouth at bedtime., Disp: 90 tablet, Rfl: 1   Ferrous Sulfate (IRON) 325 (65 Fe) MG TABS, Take 1 each by mouth every other day. , Disp: , Rfl:    Folic Acid-Vit C1-KGY J85 (FOLBEE) 2.5-25-1 MG TABS tablet, Take 1 tablet by mouth daily., Disp: 90 tablet, Rfl: 0   lovastatin (MEVACOR) 40 MG tablet, Take 40 mg by mouth at bedtime., Disp: , Rfl:    Multiple Vitamin (MULTIVITAMIN) capsule, Take 1 capsule by mouth daily., Disp: , Rfl:    potassium chloride (KLOR-CON) 20 MEQ packet, Take by mouth daily., Disp: , Rfl:    timolol (TIMOPTIC) 0.5 % ophthalmic solution, Place 1 drop into both eyes 2 (two) times daily., Disp: , Rfl:    VITAMIN D PO, Take 1,000 Units by mouth daily., Disp: , Rfl:    ZINC OXIDE PO, Take by mouth daily., Disp: , Rfl:   PAST MEDICAL HISTORY: Past Medical History:  Diagnosis Date   Chronic kidney disease    Diabetes mellitus without complication ([place])    Endometrial cancer ([facility])    Glaucoma    Heart murmur    History of breast cancer    Hypertension    TIA (transient ischemic attack)     PAST SURGICAL HISTORY: Past Surgical History:  Procedure Laterality Date   ABDOMINAL HYSTERECTOMY  1970's   CATARACT EXTRACTION  2008    LAPAROSCOPIC CHOLECYSTECTOMY  2001   MASTECTOMY  09/05/2006   left   SQUAMOUS CELL CARCINOMA EXCISION  2000   nose    FAMILY HISTORY: Family History  Problem Relation Age of Onset   Heart disease Mother    Stroke Father    Heart disease Sister    Diabetes Sister     SOCIAL HISTORY:  Social History   Socioeconomic History   Marital status: Married    Spouse name: Not on file   Number  of children: 1   Years of education: Not on file   Highest education level: Not on file  Occupational History   Occupation: retired  Tobacco Use   Smoking status: Never   Smokeless tobacco: Never  Vaping Use   Vaping Use: Never used  Substance and Sexual Activity   Alcohol use: No   Drug use: No   Sexual activity: Not on file  Other Topics Concern   Not on file  Social History Narrative   Not on file   Social Determinants of Health   Financial Resource Strain: Not on file  Food Insecurity: Not on file  Transportation Needs: Not on file  Physical Activity: Not on file  Stress: Not on file  Social Connections: Not on file  Intimate Partner Violence: Not on file     PHYSICAL EXAM  Vitals:   03/03/21 1421  BP: 108/66  Pulse: 66  SpO2: 97%  Weight: 124 lb (56.2 kg)  Height: 5' 1.5" (1.562 m)    Body mass index is 23.05 kg/m.    General: The patient is well-developed and well-nourished and in no acute distress  Neck/CV:   She has carotid bruits bilaterally, louder on the left.  The heart has a regular rate and rhythm with normal sounds  Neurologic Exam  Mental status: The patient is alert and oriented x 2 at the time of the examination. The patient has reduced orientation, recall but good attention (see MMSE scores above)   Numbers of clock slightly misaligned and could not do hands.   Continued geometric pattern well.  Speech is normal.  Cranial nerves: Extraocular movements show reduced upgaze.  Facial strength and sensation was normal.  The trapezius strength was  normal.  There is no dysarthria.  The tongue is midline, and the patient has symmetric elevation of the soft palate. No obvious hearing deficits are noted.  Motor:  Muscle bulk is normal.   Tone is normal. Strength is  5 / 5 in all 4 extremities.   Sensory: Sensory testing is intact to touch and vibration in the arms.  Coordination: Cerebellar testing reveals good finger-nose-finger and heel-to-shin bilaterally.  Gait and station: Station is normal.   The gait has a mildly reduced stride and she takes 4 steps to turn-likely normal for age.   She has retropulsion..  The tandem is poor.  The Romberg is negative.  Reflexes: Deep tendon reflexes are brisk at the knees with spread bilaterally.       DIAGNOSTIC DATA (LABS, IMAGING, TESTING) - I reviewed patient records, labs, notes, testing and imaging myself where available.  Lab Results  Component Value Date   WBC 5.0 01/31/2021   HGB 11.6 (L) 01/31/2021   HCT 33.9 (L) 01/31/2021   MCV 90.4 01/31/2021   PLT 270 01/31/2021      Component Value Date/Time   NA 139 01/31/2021 1141   K 4.0 01/31/2021 1141   CL 106 01/31/2021 1141   CO2 20 (L) 01/31/2021 1141   GLUCOSE 103 (H) 01/31/2021 1141   BUN 49 (H) 01/31/2021 1141   CREATININE 1.65 (H) 01/31/2021 1141   CALCIUM 9.3 01/31/2021 1141   PROT 7.4 01/06/2019 1450   ALBUMIN 4.4 01/06/2019 1450   AST 15 01/06/2019 1450   ALT 7 01/06/2019 1450   ALKPHOS 41 01/06/2019 1450   BILITOT 0.4 01/06/2019 1450   GFRNONAA 29 (L) 01/31/2021 1141   GFRAA (L) 09/03/2006 1110    52  The eGFR has been calculated using the MDRD equation. This calculation has not been validated in all clinical       ASSESSMENT AND PLAN    1. Memory loss   2. TIA (transient ischemic attack)   3. Gait disturbance   4. Bilateral carotid artery stenosis      1.   Continue aspirin and Plavix for history of TIA.   Homocysteine was elevated in the past.   continue [place] 2.   Due to worsening  cognitive issues, we will check MRI brain and compare to 2017.    Start donepezil 5 mg.  3.   Return in 4 months. She or her daughter are advised to call me if she has any new or worsening neurologic symptoms.  45-minute office visit with the majority of the time spent face-to-face for history and physical, discussion/counseling and decision-making.  Additional time with record review and documentation.     Kaliann Coryell A. Felecia Shelling, MD, PhD 08/30/1884, 7:73 PM Certified in Neurology, Clinical Neurophysiology, Sleep Medicine, Pain Medicine and Neuroimaging  [facility] [address], [place], [place] [address] [phone]

## 2021-03-04 ENCOUNTER — Telehealth: Payer: Self-pay | Admitting: Neurology

## 2021-03-04 NOTE — Telephone Encounter (Signed)
Medicare ordre sent to [facility], [facility] they will reach out to the patient to schedule.

## 2021-03-22 ENCOUNTER — Other Ambulatory Visit: Payer: Medicare Other

## 2021-03-25 ENCOUNTER — Other Ambulatory Visit: Payer: Self-pay

## 2021-03-25 ENCOUNTER — Ambulatory Visit
Admission: RE | Admit: 2021-03-25 | Discharge: 2021-03-25 | Disposition: A | Discharge status: Home / Self Care | Payer: Medicare Other | Source: Ambulatory Visit | Attending: Neurology | Admitting: Neurology

## 2021-03-25 DIAGNOSIS — R413 Other amnesia: Secondary | ICD-10-CM

## 2021-03-29 ENCOUNTER — Telehealth: Payer: Self-pay | Admitting: *Deleted

## 2021-03-29 DIAGNOSIS — R413 Other amnesia: Secondary | ICD-10-CM

## 2021-03-29 MED ORDER — DONEPEZIL HCL 10 MG PO TABS: 10.0000 mg | ORAL_TABLET | Freq: Every day | ORAL | 1 refills | Status: DC

## 2021-03-29 NOTE — Telephone Encounter (Signed)
Called daughter, Adonis Brook back at [phone]. Phone continued to ring, unable to leave message.

## 2021-03-29 NOTE — Telephone Encounter (Signed)
-----   Message from Britt Bottom, MD sent at 03/27/2021  3:04 PM EST ----- Please let her daughter know that the MRI of the brain showed increased atrophy.  The pattern involving the medial temporal lobes more than elsewhere is consistent with Alzheimer's disease and that is most likely the cause of her memory difficulties  If she is tolerating the donepezil we can increase the dose to 10 mg daily

## 2021-03-29 NOTE — Telephone Encounter (Signed)
Dr. Felecia Shelling- see below message.   Tried calling daughter at [phone]. Received automated message that VM not set up, unable to leave message. Tried her at [phone]. Was able to speak with her and relay results. She verbalized understanding. She is tolerating donepezil 5mg  po qhs, agreeable to increase to 10mg . I e-scribe to pharmacy. She will have her take 2-5mg  tablets po qhs to use up what she has.  She wanted to know from Dr. Felecia Shelling official diagnosis and stage of Alzheimer's she has. Wanting to know what recommendations he has moving forward for her care.  She has concerns. Mother recently was planning to move to independent apartment at retirement community. Currently lives alone in apartment and has decided she wants to stay at home now. She is worried about her being there alone. Trying to line up in home care giver right now. Her mother also feeds feral cats. Mother has been more augmentative, difficult to work with. Taken car keys away during last visit. Dr. Felecia Shelling agreed she should not be driving. Mother claims he never said this.  She asked about resources. I provided her the following resource: [employer] LDLive.be [phone]

## 2021-03-30 NOTE — Telephone Encounter (Signed)
Called and spoke with daughter. Relayed message below. She verbalized understanding and appreciation. She will call in future if sx worsen to get mother in sooner for appt. ?

## 2021-04-14 ENCOUNTER — Telehealth: Payer: Self-pay | Admitting: Neurology

## 2021-04-14 NOTE — Telephone Encounter (Signed)
Pt's daughter, Caleb Popp (on [place]) pt having a side effect to donepezil (ARICEPT) 10 MG tablet ;trouble sleeping, frequency urination, throbbing headache and diarrhea. Would like a call back. ?

## 2021-04-14 NOTE — Telephone Encounter (Signed)
Dr. Felecia Shelling- FYI. Let me know if you would like to do anything further.  ? ? I called daughter back. She increased to '10mg'$  po qd 10 days ago. Following sx started since increase in dose: trouble getting to sleep (sometimes does not go to sleep until 4am), throbbing pain in forehead/temple, frequent urination, mild diarrhea. I recommended she go back to '5mg'$  po qd to see if this resolves sx. If over the next week they do not see improvement, asked they call back to let us know to talk about other options. She verbalized understanding. ?

## 2021-07-05 ENCOUNTER — Ambulatory Visit (INDEPENDENT_AMBULATORY_CARE_PROVIDER_SITE_OTHER): Payer: Medicare Other | Admitting: Neurology

## 2021-07-05 ENCOUNTER — Encounter: Payer: Self-pay | Admitting: Neurology

## 2021-07-05 VITALS — BP 170/65 | HR 69 | Ht 61.5 in | Wt 135.0 lb

## 2021-07-05 DIAGNOSIS — R3915 Urgency of urination: Secondary | ICD-10-CM | POA: Diagnosis not present

## 2021-07-05 DIAGNOSIS — I6523 Occlusion and stenosis of bilateral carotid arteries: Secondary | ICD-10-CM

## 2021-07-05 DIAGNOSIS — G309 Alzheimer's disease, unspecified: Secondary | ICD-10-CM

## 2021-07-05 DIAGNOSIS — F0280 Dementia in other diseases classified elsewhere without behavioral disturbance: Secondary | ICD-10-CM | POA: Diagnosis not present

## 2021-07-05 DIAGNOSIS — G459 Transient cerebral ischemic attack, unspecified: Secondary | ICD-10-CM

## 2021-07-05 DIAGNOSIS — W19XXXA Unspecified fall, initial encounter: Secondary | ICD-10-CM

## 2021-07-05 DIAGNOSIS — F418 Other specified anxiety disorders: Secondary | ICD-10-CM

## 2021-07-05 MED ORDER — SERTRALINE HCL 25 MG PO TABS: 25.0000 mg | ORAL_TABLET | Freq: Every day | ORAL | 3 refills | Status: DC

## 2021-07-05 NOTE — Progress Notes (Signed)
[facility]  PATIENT: [name] DOB: 08-14-1924  REFERRING DOCTOR OR PCP:  Dr. Rita Ohara SOURCE: Patient and records  _________________________________   HISTORICAL  CHIEF COMPLAINT:  Chief Complaint  Patient presents with   Follow-up    Rm 2, w daughter. Here to f/u for memory. Pt reports no change in memory. Continues to have in increase in urination. Less then when she was taking 47m of donepezil. MMSE:16    HISTORY OF PRESENT ILLNESS:  Amber Bankheadis an 88year old woman with a history of TIA and more recent memory loss  Update 07/05/2021: She has had more difficulty with STM over the last year.   She also forgets to eat and does not have much appetite.    She has been more forgetful with medications.  Concept of time and number concepts are more difficult.     Since the last visit she had a repeat MRI of the brain 03/26/2021.  I reviewed it in her presence.  It shows atrophy most pronounced in the medial temporal lobes.  This has markedly progressed compared to the 2017 imaging study.  I discussed with her and her daughter that this pattern is most concerning for Alzheimer's disease.  There was some chronic microvascular ischemic changes but there were no acute findings.  She has had some auditory hallucinations (like hearing daughter or others when they are not there).    Physically, she feels she is walking slower and balance is off but does not need support.    She last drove a week ago but daughte has taken keys as she has several incidents..   She had difficulty finding her car in a parking lot and another time she couldn't figure how to open the car door (car was still in drive).   She has had other episodes of confusion that are concerning.   She has tried to look for her daughter or granddaughter in her yard when they do not live with her.        She fell yesterday hitting her face (has bruising).   She has some subconjuctival blood.    She took  about 20 minutes for her to get herself back up.      She got out of bed because she thought she heard someone knocking.     These are occurring less than previously.       She tries to interact with others through her church.  Due to her memory issues I recommended that she stop driving after the last visit..     She has had depression - bothers her more at night after lying down and has had some tearful spells.     She sleeps well.     She snores but never is sleepy and does not take naps.    B12, TSH, CBC and CMP were normal or noncontributory 02/23/2021      07/05/2021    3:12 PM 03/03/2021    2:29 PM  MMSE - Mini Mental State Exam  Orientation to time 2 1  Orientation to Place 4 2  Registration 2 3  Attention/ Calculation 0 5  Recall 1 1  Language- name 2 objects 1 2  Language- repeat 1 1  Language- follow 3 step command 2 1  Language- read & follow direction 1 1  Write a sentence 1 1  Copy design 1 0  Total score 16 18     She denies any new TIA symptoms.  She is on clopidogrel and 81 mg aspirin.  No episodes of TIA-like weakness, numbness or speech difficulty.    She does note occasional numbness in a hand when she lays on her amrs but it improves with movements.     TIAs: She had a TIA 2007 associated with reduced speech. Symptoms lasted 3-5 minutes and Doppler study showed 40-59% stenosis bilaterally. In 03/24/2015, she had a TIA/RIND.      She has several CVA risk factors including hypercholesterolemia, HTN and pre-diabetes.   Also, she has bilateral carotid stenosis.     She has elevated homocysteine and is on [place].    REVIEW OF SYSTEMS: Constitutional: No fevers, chills, sweats, or change in appetite Eyes: No visual changes, double vision, eye pain Ear, nose and throat: No hearing loss, ear pain, nasal congestion, sore throat Cardiovascular: No chest pain, palpitations Respiratory:  No shortness of breath at rest or with exertion.   No wheezes GastrointestinaI: No  nausea, vomiting, diarrhea, abdominal pain, fecal incontinence Genitourinary:  She has mild frequency and some nocturia. Musculoskeletal:  Some neck pain > back pain Integumentary: No rash, pruritus, skin lesions Neurological: as above Psychiatric: No depression at this time.  No anxiety Endocrine: No palpitations, diaphoresis, change in appetite, change in weigh or increased thirst Hematologic/Lymphatic:  No anemia, purpura, petechiae. Allergic/Immunologic: No itchy/runny eyes, nasal congestion, recent allergic reactions, rashes  ALLERGIES: Allergies  Allergen Reactions   Darvon [Propoxyphene Hcl] Anaphylaxis   Codeine Nausea And Vomiting   Lisinopril Swelling    facial edema    HOME MEDICATIONS:  Current Outpatient Medications:    Ascorbic Acid (VITA-C PO), Take 500 mg by mouth daily., Disp: , Rfl:    aspirin 81 MG tablet, Take 81 mg by mouth daily., Disp: , Rfl:    chlorthalidone (HYGROTON) 25 MG tablet, Take 12.5 mg by mouth daily., Disp: , Rfl:    clopidogrel (PLAVIX) 75 MG tablet, TAKE 1 TABLET BY MOUTH EVERY DAY, Disp: 90 tablet, Rfl: 3   donepezil (ARICEPT) 5 MG tablet, Take 1 tablet by mouth at bedtime., Disp: , Rfl:    Ferrous Sulfate (IRON) 325 (65 Fe) MG TABS, Take 1 each by mouth every other day. , Disp: , Rfl:    Folic Acid-Vit V6-PQA E49 (FOLBEE) 2.5-25-1 MG TABS tablet, Take 1 tablet by mouth daily., Disp: 90 tablet, Rfl: 0   lovastatin (MEVACOR) 40 MG tablet, Take 40 mg by mouth at bedtime., Disp: , Rfl:    Multiple Vitamin (MULTIVITAMIN) capsule, Take 1 capsule by mouth daily., Disp: , Rfl:    potassium chloride (KLOR-CON) 10 MEQ tablet, Take 20 mEq by mouth daily., Disp: , Rfl:    sertraline (ZOLOFT) 25 MG tablet, Take 1 tablet (25 mg total) by mouth daily., Disp: 90 tablet, Rfl: 3   timolol (TIMOPTIC) 0.5 % ophthalmic solution, Place 1 drop into both eyes 2 (two) times daily., Disp: , Rfl:    VITAMIN D PO, Take 1,000 Units by mouth daily., Disp: , Rfl:    ZINC  OXIDE PO, Take by mouth daily., Disp: , Rfl:   PAST MEDICAL HISTORY: Past Medical History:  Diagnosis Date   Chronic kidney disease    Diabetes mellitus without complication (HCC)    Endometrial cancer ([place])    Glaucoma    Heart murmur    History of breast cancer    Hypertension    TIA (transient ischemic attack)     PAST SURGICAL HISTORY: Past Surgical History:  Procedure Laterality Date  ABDOMINAL HYSTERECTOMY  1970's   CATARACT EXTRACTION  2008   LAPAROSCOPIC CHOLECYSTECTOMY  2001   MASTECTOMY  09/05/2006   left   SQUAMOUS CELL CARCINOMA EXCISION  2000   nose    FAMILY HISTORY: Family History  Problem Relation Age of Onset   Heart disease Mother    Stroke Father    Heart disease Sister    Diabetes Sister     SOCIAL HISTORY:  Social History   Socioeconomic History   Marital status: Married    Spouse name: Not on file   Number of children: 1   Years of education: Not on file   Highest education level: Not on file  Occupational History   Occupation: retired  Tobacco Use   Smoking status: Never   Smokeless tobacco: Never  Vaping Use   Vaping Use: Never used  Substance and Sexual Activity   Alcohol use: No   Drug use: No   Sexual activity: Not on file  Other Topics Concern   Not on file  Social History Narrative   Not on file   Social Determinants of Health   Financial Resource Strain: Not on file  Food Insecurity: Not on file  Transportation Needs: Not on file  Physical Activity: Not on file  Stress: Not on file  Social Connections: Not on file  Intimate Partner Violence: Not on file     PHYSICAL EXAM  Vitals:   07/05/21 1502  BP: (!) 170/65  Pulse: 69  Weight: 135 lb (61.2 kg)  Height: 5' 1.5" (1.562 m)    Body mass index is 25.09 kg/m.    General: The patient is well-developed and well-nourished and in no acute distress.  There are posttraumatic changes in the face with bruising under the eyes and subconjunctival hemorrhage of  the right cornea.  Neck/CV:   She has carotid bruits bilaterally, louder on the left.  The heart has a regular rate and rhythm with normal sounds  Neurologic Exam  Mental status: The patient is alert and oriented x 2 at the time of the examination. The patient has reduced orientation, recall but good attention (see MMSE scores above)   Numbers of clock slightly misaligned and could not do hands.  Speech is normal. Cranial nerves: Extraocular movements show reduced upgaze.  Facial strength and sensation was normal.  The trapezius strength was normal.  There is no dysarthria.  The tongue is midline, and the patient has symmetric elevation of the soft palate. No obvious hearing deficits are noted.  Motor:  Muscle bulk is normal.   Tone is normal. Strength is  5 / 5 in all 4 extremities.   Sensory: Sensory testing is intact to touch and vibration in the arms.  Coordination: Cerebellar testing reveals good finger-nose-finger and heel-to-shin bilaterally.  Gait and station: Station is normal.   The gait has a mildly reduced stride and can turn in 4 steps which is likely normal for age.   She has retropulsion..  The tandem is poor.  The Romberg is negative.  Reflexes: Deep tendon reflexes are brisk at the knees with spread bilaterally.       DIAGNOSTIC DATA (LABS, IMAGING, TESTING) - I reviewed patient records, labs, notes, testing and imaging myself where available.  Lab Results  Component Value Date   WBC 5.0 01/31/2021   HGB 11.6 (L) 01/31/2021   HCT 33.9 (L) 01/31/2021   MCV 90.4 01/31/2021   PLT 270 01/31/2021      Component Value Date/Time  NA 139 01/31/2021 1141   K 4.0 01/31/2021 1141   CL 106 01/31/2021 1141   CO2 20 (L) 01/31/2021 1141   GLUCOSE 103 (H) 01/31/2021 1141   BUN 49 (H) 01/31/2021 1141   CREATININE 1.65 (H) 01/31/2021 1141   CALCIUM 9.3 01/31/2021 1141   PROT 7.4 01/06/2019 1450   ALBUMIN 4.4 01/06/2019 1450   AST 15 01/06/2019 1450   ALT 7 01/06/2019 1450    ALKPHOS 41 01/06/2019 1450   BILITOT 0.4 01/06/2019 1450   GFRNONAA 29 (L) 01/31/2021 1141   GFRAA (L) 09/03/2006 1110    52        The eGFR has been calculated using the MDRD equation. This calculation has not been validated in all clinical       ASSESSMENT AND PLAN    1. Alzheimer's disease ([place])   2. TIA (transient ischemic attack)   3. Bilateral carotid artery stenosis   4. Urinary urgency   5. Fall, initial encounter   6. Depression with anxiety       1.   Continue aspirin and Plavix for history of TIA.   Homocysteine was elevated in the past.   continue Folbee 2.   D continue donepezil 5 mg.  She has been unable to tolerate 10 mg.  We discussed adding memantine.  However, since she also is showing some depression and anxiety I would like to add an SSRI and will hold off on the memantine so we do not have to medication starting at the same time. 3.   She had a fall yesterday and shows evidence of the trauma.  There was no loss of consciousness.  Her physical examination today is unchanged compared to her previous exam and she is showing no evidence of significant cognitive change compared to her last exam.  Therefore, I will hold off on a CT scan as 24 hours has passed.  I did recommend to her and her daughter that if there are any significant changes over the next couple weeks that we would need to get an urgent imaging study.   4.   We had a conversation about Alzheimer's, prognosis.  She is currently living by herself and has a home health aide 3 days a week and her daughter stops by 3 days a week.  It is probable that she will progress over the next couple years and it may be necessary for her to have a different living arrangement.   Return in 6 months. She or her daughter are advised to call me if she has any new or worsening neurologic symptoms.  42-minute office visit with the majority of the time spent face-to-face for history and physical, discussion/counseling and  decision-making.  Additional time with record review and documentation.    Kadyn Guild A. Felecia Shelling, MD, PhD 02/04/1094, 0:45 PM Certified in Neurology, Clinical Neurophysiology, Sleep Medicine, Pain Medicine and Neuroimaging  [facility] [address] [place], [place] [address] [phone]

## 2021-08-23 ENCOUNTER — Other Ambulatory Visit: Payer: Self-pay | Admitting: Neurology

## 2021-12-13 ENCOUNTER — Other Ambulatory Visit: Payer: Self-pay | Admitting: Neurology

## 2022-01-19 ENCOUNTER — Ambulatory Visit: Payer: Medicare Other | Admitting: Neurology

## 2022-02-27 ENCOUNTER — Other Ambulatory Visit: Payer: Self-pay | Admitting: Neurology

## 2022-05-15 ENCOUNTER — Ambulatory Visit
Admission: RE | Admit: 2022-05-15 | Discharge: 2022-05-15 | Disposition: A | Discharge status: Home / Self Care | Payer: Medicare Other | Source: Ambulatory Visit | Attending: Neurology | Admitting: Neurology

## 2022-05-15 ENCOUNTER — Encounter: Payer: Self-pay | Admitting: Neurology

## 2022-05-15 ENCOUNTER — Ambulatory Visit (INDEPENDENT_AMBULATORY_CARE_PROVIDER_SITE_OTHER): Payer: Medicare Other | Admitting: Neurology

## 2022-05-15 VITALS — BP 148/69 | HR 69 | Ht 62.0 in | Wt 132.5 lb

## 2022-05-15 DIAGNOSIS — M25511 Pain in right shoulder: Secondary | ICD-10-CM

## 2022-05-15 DIAGNOSIS — G459 Transient cerebral ischemic attack, unspecified: Secondary | ICD-10-CM | POA: Diagnosis not present

## 2022-05-15 DIAGNOSIS — F0280 Dementia in other diseases classified elsewhere without behavioral disturbance: Secondary | ICD-10-CM

## 2022-05-15 DIAGNOSIS — I6523 Occlusion and stenosis of bilateral carotid arteries: Secondary | ICD-10-CM

## 2022-05-15 DIAGNOSIS — G8929 Other chronic pain: Secondary | ICD-10-CM

## 2022-05-15 DIAGNOSIS — G309 Alzheimer's disease, unspecified: Secondary | ICD-10-CM | POA: Diagnosis not present

## 2022-05-15 MED ORDER — WESTAB ONE 2.5-25-1 MG PO TABS
1.0000 | ORAL_TABLET | Freq: Every day | ORAL | 3 refills | Status: DC
Start: 2022-05-15 — End: 2023-03-19

## 2022-05-15 MED ORDER — DONEPEZIL HCL 5 MG PO TABS: 5.0000 mg | ORAL_TABLET | Freq: Every day | ORAL | 3 refills | Status: DC

## 2022-05-15 NOTE — Progress Notes (Signed)
[facility]  PATIENT: [name] DOB: 10/31/1924  REFERRING DOCTOR OR PCP:  Dr. Royanne Foots SOURCE: Patient and records  _________________________________   HISTORICAL  CHIEF COMPLAINT:  Chief Complaint  Patient presents with   Room 10    Pt is here with her Daughter. Pt's Daughter states that pt is having pain going down her right arm. Pt states that she has pins and needles feeling in her right arm that also give her a burning sensation, Pt's Daughter pt's memory started declining 2 years ago. Pt's daughter states that within the last 6 months pt's memory has been stable.     HISTORY OF PRESENT ILLNESS:  [name] is an 89 year old woman with a history of TIA and more recent memory loss  Update 05/15/2022: Today, she was upset that we were testing her memory and did not complete the MMSE.  She feels she is doing well.   She lives by herself and feels she can do regular chores.   Her daughter notes that physically, she is having more difficulty with her gait.   She has more trouble with some tasks can't write checks anymore).   She has had only one fall while at church, losing balance in the parking lot.   She lives alone but has caregivers 3 days a week and duaghter is there 2 days a week.    Daughter notes Aylin has more difficulty with STM over the last year.   She also forgets to eat and does not have much appetite.    She is better with her medications.  Concept of time and number concepts are more difficult.     She is upset that we assessed memory and that we have told her that she cannot drive.   She denies depression and preferred not to start sertraline.  No recent auditory hallucinations.  She has had more trouble down the right arm from shoulder to elbow.  She can carry items if arm hangs down but has difficulty raising arm over head.    She was diagnosed with a C5or C6 radiculopathy at the spine surgeon and ESI was recommended,   Since the  last visit she had a repeat MRI of the brain 03/26/2021.  I reviewed it in her presence.  It shows atrophy most pronounced in the medial temporal lobes.  This has markedly progressed compared to the 2017 imaging study.  I discussed with her and her daughter that this pattern is most concerning for Alzheimer's disease.  There was some chronic microvascular ischemic changes but there were no acute findings.   She last drove in early 2023 but daughter has taken keys as she has several incidents..  I have told her that I do not think that she should drive.     She sleeps well.     She snores but never is sleepy and does not take naps.    B12, TSH, CBC and CMP were normal or noncontributory 02/23/2021      05/15/2022   11:09 AM 07/05/2021    3:12 PM 03/03/2021    2:29 PM  MMSE - Mini Mental State Exam  Orientation to time 0 2 1  Orientation to Place Registration Attention/ Calculation  0 5  Recall  1 1  Language- name 2 objects  1 2  Language- repeat  1 1  Language- follow 3 step command  2 1  Language- read & follow  direction  1 1  Write a sentence  1 1  Copy design  1 0  Total score  16 18     She denies any new TIA symptoms.   She is on clopidogrel and 81 mg aspirin.  No episodes of TIA-like weakness, numbness or speech difficulty.    She does note occasional numbness in a hand when she lays on her amrs but it improves with movements.     TIAs: She had a TIA 2007 associated with reduced speech. Symptoms lasted 3-5 minutes and Doppler study showed 40-59% stenosis bilaterally. In 03/24/2015, she had a TIA/RIND.      She has several CVA risk factors including hypercholesterolemia, HTN and pre-diabetes.   Also, she has bilateral carotid stenosis.     She has elevated homocysteine and is on [place].    REVIEW OF SYSTEMS: Constitutional: No fevers, chills, sweats, or change in appetite Eyes: No visual changes, double vision, eye pain Ear, nose and throat: No hearing loss, ear pain,  nasal congestion, sore throat Cardiovascular: No chest pain, palpitations Respiratory:  No shortness of breath at rest or with exertion.   No wheezes GastrointestinaI: No nausea, vomiting, diarrhea, abdominal pain, fecal incontinence Genitourinary:  She has mild frequency and some nocturia. Musculoskeletal:  Some neck pain > back pain Integumentary: No rash, pruritus, skin lesions Neurological: as above Psychiatric: No depression at this time.  No anxiety Endocrine: No palpitations, diaphoresis, change in appetite, change in weigh or increased thirst Hematologic/Lymphatic:  No anemia, purpura, petechiae. Allergic/Immunologic: No itchy/runny eyes, nasal congestion, recent allergic reactions, rashes  ALLERGIES: Allergies  Allergen Reactions   Darvon [Propoxyphene Hcl] Anaphylaxis   Codeine Nausea And Vomiting   Lisinopril Swelling    facial edema    HOME MEDICATIONS:  Current Outpatient Medications:    Ascorbic Acid (VITA-C PO), Take 500 mg by mouth daily., Disp: , Rfl:    aspirin 81 MG tablet, Take 81 mg by mouth daily., Disp: , Rfl:    chlorthalidone (HYGROTON) 25 MG tablet, Take 12.5 mg by mouth daily., Disp: , Rfl:    clopidogrel (PLAVIX) 75 MG tablet, TAKE 1 TABLET BY MOUTH EVERY DAY, Disp: 90 tablet, Rfl: 3   Ferrous Sulfate (IRON) 325 (65 Fe) MG TABS, Take 1 each by mouth every other day. , Disp: , Rfl:    lovastatin (MEVACOR) 40 MG tablet, Take 40 mg by mouth at bedtime., Disp: , Rfl:    Multiple Vitamin (MULTIVITAMIN) capsule, Take 1 capsule by mouth daily., Disp: , Rfl:    potassium chloride (KLOR-CON) 10 MEQ tablet, Take 20 mEq by mouth daily., Disp: , Rfl:    timolol (TIMOPTIC) 0.5 % ophthalmic solution, Place 1 drop into both eyes 2 (two) times daily., Disp: , Rfl:    VITAMIN D PO, Take 1,000 Units by mouth daily., Disp: , Rfl:    ZINC OXIDE PO, Take by mouth daily., Disp: , Rfl:    donepezil (ARICEPT) 5 MG tablet, Take 1 tablet (5 mg total) by mouth at bedtime., Disp:  90 tablet, Rfl: 3   Folic Acid-Vit B6-Vit B12 ([place]) 2.5-25-1 MG TABS tablet, Take 1 tablet by mouth daily., Disp: 90 tablet, Rfl: 3  PAST MEDICAL HISTORY: Past Medical History:  Diagnosis Date   Chronic kidney disease    Diabetes mellitus without complication    Endometrial cancer    Glaucoma    Heart murmur    History of breast cancer    Hypertension    TIA (transient  ischemic attack)     PAST SURGICAL HISTORY: Past Surgical History:  Procedure Laterality Date   ABDOMINAL HYSTERECTOMY  1970's   CATARACT EXTRACTION  2008   LAPAROSCOPIC CHOLECYSTECTOMY  2001   MASTECTOMY  09/05/2006   left   SQUAMOUS CELL CARCINOMA EXCISION  2000   nose    FAMILY HISTORY: Family History  Problem Relation Age of Onset   Heart disease Mother    Stroke Father    Heart disease Sister    Diabetes Sister     SOCIAL HISTORY:  Social History   Socioeconomic History   Marital status: Married    Spouse name: Not on file   Number of children: 1   Years of education: Not on file   Highest education level: Not on file  Occupational History   Occupation: retired  Tobacco Use   Smoking status: Never   Smokeless tobacco: Never  Vaping Use   Vaping Use: Never used  Substance and Sexual Activity   Alcohol use: No   Drug use: No   Sexual activity: Not on file  Other Topics Concern   Not on file  Social History Narrative   Right Handed    2 Cups of Coffee per Day   Social Determinants of Health   Financial Resource Strain: Not on file  Food Insecurity: Not on file  Transportation Needs: Not on file  Physical Activity: Not on file  Stress: Not on file  Social Connections: Not on file  Intimate Partner Violence: Not on file     PHYSICAL EXAM  Vitals:   05/15/22 1103  BP: (!) 148/69  Pulse: 69  Weight: 132 lb 8 oz (60.1 kg)  Height:  (1.575 m)    Body mass index is 24.23 kg/m.    General: The patient is well-developed and well-nourished and in no acute  distress.  There is mild tenderness over the right subacromial bursa and mild to moderate tenderness over the glenohumeral joint.  Range of motion is reduced.    Neck/CV:   She has carotid bruits bilaterally, louder on the left.  The heart has a regular rate and rhythm with normal sounds  Neurologic Exam  Mental status: The patient is alert and oriented x 2 at the time of the examination. The patient has reduced orientation, short-term memory and focus. Marland Kitchen  Speech is normal.  Cranial nerves: Extraocular movements show reduced upgaze.  Facial strength and sensation was normal.  The trapezius strength was normal.  There is no dysarthria.  The tongue is midline, and the patient has symmetric elevation of the soft palate. No obvious hearing deficits are noted.  Motor:  Muscle bulk is normal.   Tone is normal. Strength is  5 / 5 in all 4 extremities.   Sensory: Sensory testing is intact to touch and vibration in the arms.  Coordination: Cerebellar testing reveals good finger-nose-finger and heel-to-shin bilaterally.  Gait and station: Station is normal.   The gait has a mildly reduced stride and can turn in 4 steps which is likely normal for age.   She has mild retropulsion..  The tandem is poor.  The Romberg is negative.  Reflexes: Deep tendon reflexes are brisk at the knees with spread bilaterally.       DIAGNOSTIC DATA (LABS, IMAGING, TESTING) - I reviewed patient records, labs, notes, testing and imaging myself where available.  Lab Results  Component Value Date   WBC 5.0 01/31/2021   HGB 11.6 (L) 01/31/2021  HCT 33.9 (L) 01/31/2021   MCV 90.4 01/31/2021   PLT 270 01/31/2021      Component Value Date/Time   NA 139 01/31/2021 1141   K 4.0 01/31/2021 1141   CL 106 01/31/2021 1141   CO2 20 (L) 01/31/2021 1141   GLUCOSE 103 (H) 01/31/2021 1141   BUN 49 (H) 01/31/2021 1141   CREATININE 1.65 (H) 01/31/2021 1141   CALCIUM 9.3 01/31/2021 1141   PROT 7.4 01/06/2019 1450   ALBUMIN  4.4 01/06/2019 1450   AST 15 01/06/2019 1450   ALT 7 01/06/2019 1450   ALKPHOS 41 01/06/2019 1450   BILITOT 0.4 01/06/2019 1450   GFRNONAA 29 (L) 01/31/2021 1141   GFRAA (L) 09/03/2006 1110    52        The eGFR has been calculated using the MDRD equation. This calculation has not been validated in all clinical       ASSESSMENT AND PLAN    1. Chronic right shoulder pain   2. Alzheimer's disease   3. TIA (transient ischemic attack)   4. Bilateral carotid artery stenosis      1.   Continue aspirin and Plavix for history of TIA.   Homocysteine was elevated in the past.   continue [place]bee 2.   Continue donepezil 5 mg.  She has been unable to tolerate 10 mg.  We also discussed that if she does have depression the SSRI might help.  She is not a candidate for lecanemab.   3.    We had a conversation about Alzheimer's, prognosis.  She is currently living by herself and has a home health aide 3 days a week and her daughter stops by 2-3 days a week.  As long as this is working for everybody there is no need for different arrangement.  However it is probable that she will progress over the next couple years and it may be necessary for her to have a different living arrangement.   4.  Right shoulder pain appears to be more likely due to shoulder degenerative changes rather than cervical spine.  We will check an x-ray.  Consider referral to orthopedics based on results.  5.  Return in 12 months. She or her daughter are advised to call me if she has any new or worsening neurologic symptoms.  40-minute office visit with the majority of the time spent face-to-face for history and physical, discussion/counseling and decision-making.  Additional time with record review and documentation.   This visit is part of a comprehensive longitudinal care medical relationship regarding the patients primary diagnosis of Alzheimer' and related concerns.   Keyaira Clapham A. Epimenio Foot, MD, PhD 05/15/2022, 3:51  PM Certified in Neurology, Clinical Neurophysiology, Sleep Medicine, Pain Medicine and Neuroimaging  [facility] [address], [address] [place], [place] [address] [phone]

## 2022-05-30 ENCOUNTER — Encounter: Payer: Self-pay | Admitting: Neurology

## 2022-05-30 ENCOUNTER — Other Ambulatory Visit: Payer: Self-pay | Admitting: *Deleted

## 2022-05-30 ENCOUNTER — Telehealth: Payer: Self-pay | Admitting: Neurology

## 2022-05-30 DIAGNOSIS — G8929 Other chronic pain: Secondary | ICD-10-CM

## 2022-05-30 NOTE — Telephone Encounter (Signed)
Orthopedics referral faxed to [facility] (fax# [phone], phone# [phone])

## 2022-05-30 NOTE — Telephone Encounter (Signed)
Appears Dr. Epimenio Foot sent mychart about results 05/17/22: "Please let the daughter (Cristie) know that the shoulder shows extensive arthritic change --- would she like [place] to refer to Ortho ([facility] might be closer)   Referral "89 yo woman with right shoulder pain, reduced range of motion and extensive right shoulder degenerative change"

## 2022-06-29 ENCOUNTER — Encounter: Payer: Self-pay | Admitting: Neurology

## 2022-09-13 ENCOUNTER — Encounter: Payer: Self-pay | Admitting: Neurology

## 2022-09-14 ENCOUNTER — Other Ambulatory Visit: Payer: Self-pay | Admitting: Neurology

## 2022-09-14 MED ORDER — BUPROPION HCL ER (XL) 150 MG PO TB24: 150.0000 mg | ORAL_TABLET | Freq: Every day | ORAL | 3 refills | Status: AC

## 2022-09-14 MED ORDER — BUPROPION HCL ER (SR) 150 MG PO TB12: 150.0000 mg | ORAL_TABLET | Freq: Two times a day (BID) | ORAL | 3 refills | Status: DC

## 2022-10-02 ENCOUNTER — Encounter: Payer: Self-pay | Admitting: Neurology

## 2022-11-30 ENCOUNTER — Encounter: Payer: Self-pay | Admitting: Neurology

## 2022-11-30 ENCOUNTER — Ambulatory Visit: Payer: Medicare Other | Admitting: Neurology

## 2022-11-30 VITALS — BP 132/75 | HR 60 | Ht 62.0 in | Wt 137.5 lb

## 2022-11-30 DIAGNOSIS — G309 Alzheimer's disease, unspecified: Secondary | ICD-10-CM | POA: Diagnosis not present

## 2022-11-30 DIAGNOSIS — M545 Low back pain, unspecified: Secondary | ICD-10-CM

## 2022-11-30 DIAGNOSIS — I4891 Unspecified atrial fibrillation: Secondary | ICD-10-CM | POA: Diagnosis not present

## 2022-11-30 DIAGNOSIS — M25511 Pain in right shoulder: Secondary | ICD-10-CM

## 2022-11-30 DIAGNOSIS — F418 Other specified anxiety disorders: Secondary | ICD-10-CM

## 2022-11-30 DIAGNOSIS — G459 Transient cerebral ischemic attack, unspecified: Secondary | ICD-10-CM

## 2022-11-30 DIAGNOSIS — F0280 Dementia in other diseases classified elsewhere without behavioral disturbance: Secondary | ICD-10-CM | POA: Diagnosis not present

## 2022-11-30 DIAGNOSIS — I6523 Occlusion and stenosis of bilateral carotid arteries: Secondary | ICD-10-CM

## 2022-11-30 DIAGNOSIS — G8929 Other chronic pain: Secondary | ICD-10-CM

## 2022-11-30 MED ORDER — TRAMADOL HCL 50 MG PO TABS: ORAL_TABLET | ORAL | 5 refills | Status: DC

## 2022-11-30 NOTE — Progress Notes (Signed)
[facility]  PATIENT: [name] DOB: [birth year]  REFERRING DOCTOR OR PCP:  Dr. Royanne Foots SOURCE: Patient and records  _________________________________   HISTORICAL  CHIEF COMPLAINT:  Chief Complaint  Patient presents with   Follow-up    Pt in room 10, daughter in room.Here for memory follow up. Patient reports memory is stable, daughter agrees. MMSE:17    HISTORY OF PRESENT ILLNESS:  [name] is an 89 year old woman with a history of TIA and more recent memory loss  Update 11/30/2022: Her daughter notes her memory was worse associated with a hospitalization (after a fall for one and after new onset AFib for another) but then went back to baseline..  She now has an attendant 7 days a week.     She gets out of the house 3-4 times a week for church and a meal.  Physically, she walks outside her home 5-10 minutes 2-3 times a day.    She eats well.    She sleeps between 730 pm and 6 am most days.  She does not need naps.    The attendant is present 10 am to 6 pm and does cooking and cleaning.       With her new onset AFib, the Plavix was changed to Eliquis and metoprolol was started.   She has had RRR recently (she sees [facility])  Daughter notes Innocence seems stable compared to earlier.  She also scored about the same on the [facility] cognitive assessment as she did in 2023.  Earlier this year the the test was not completed as she became upset.     Concept of time and number concepts are more difficult.     She is experiencing a lot of pain in the right arm predominantly in the shoulder.   She can carry items if arm hangs down but has difficulty raising arm over head.  She has a rotator cuff tear.  Injections had not helped.   A Voltaren gel had not helped.    Orthopedics has not recommended surgery.      Repeat MRI of the brain 03/26/2021.  I reviewed it in her presence.  It shows atrophy most pronounced in the medial temporal lobes.  This has  markedly progressed compared to the 2017 imaging study.  I discussed with her and her daughter that this pattern is most concerning for Alzheimer's disease.  There was some chronic microvascular ischemic changes but there were no acute findings.  She last drove in early 2023.  She had not been accepting of this initially but is fine now  She sleeps well.     She snores but never is sleepy and does not take naps.    B12, TSH, CBC and CMP were normal or noncontributory 02/23/2021      11/30/2022   12:48 PM 05/15/2022   11:09 AM 07/05/2021    3:12 PM  MMSE - Mini Mental State Exam  Orientation to time 1 0 2  Orientation to Place 3 2 4   Registration 3 3 2   Attention/ Calculation 0  0  Recall 2  1  Language- name 2 objects 2  1  Language- repeat 1  1  Language- follow 3 step command 3  2  Language- read & follow direction 1  1  Write a sentence 1  1  Copy design 0  1  Total score 17  16     She denies any new TIA symptoms.   She is  on clopidogrel and 81 mg aspirin.  No episodes of TIA-like weakness, numbness or speech difficulty.    She does note occasional numbness in a hand when she lays on her amrs but it improves with movements.     TIAs: She had a TIA 2007 associated with reduced speech. Symptoms lasted 3-5 minutes and Doppler study showed 40-59% stenosis bilaterally. In 03/24/2015, she had a TIA/RIND.      She has several CVA risk factors including hypercholesterolemia, HTN and pre-diabetes.   Also, she has bilateral carotid stenosis.     She has elevated homocysteine and is on [place].    REVIEW OF SYSTEMS: Constitutional: No fevers, chills, sweats, or change in appetite Eyes: No visual changes, double vision, eye pain Ear, nose and throat: No hearing loss, ear pain, nasal congestion, sore throat Cardiovascular: No chest pain, palpitations Respiratory:  No shortness of breath at rest or with exertion.   No wheezes GastrointestinaI: No nausea, vomiting, diarrhea, abdominal pain,  fecal incontinence Genitourinary:  She has mild frequency and some nocturia. Musculoskeletal:  Some neck pain > back pain Integumentary: No rash, pruritus, skin lesions Neurological: as above Psychiatric: No depression at this time.  No anxiety Endocrine: No palpitations, diaphoresis, change in appetite, change in weigh or increased thirst Hematologic/Lymphatic:  No anemia, purpura, petechiae. Allergic/Immunologic: No itchy/runny eyes, nasal congestion, recent allergic reactions, rashes  ALLERGIES: Allergies  Allergen Reactions   Darvon [Propoxyphene Hcl] Anaphylaxis   Codeine Nausea And Vomiting   Lisinopril Swelling    facial edema    HOME MEDICATIONS:  Current Outpatient Medications:    Ascorbic Acid (VITA-C PO), Take 500 mg by mouth daily., Disp: , Rfl:    aspirin 81 MG tablet, Take 81 mg by mouth daily., Disp: , Rfl:    donepezil (ARICEPT) 5 MG tablet, Take 1 tablet (5 mg total) by mouth at bedtime., Disp: 90 tablet, Rfl: 3   ELIQUIS 2.5 MG TABS tablet, Take 2.5 mg by mouth 2 (two) times daily., Disp: , Rfl:    Ferrous Sulfate (IRON) 325 (65 Fe) MG TABS, Take 1 each by mouth every other day. , Disp: , Rfl:    Folic Acid-Vit B6-Vit B12 (WESTAB ONE) 2.5-25-1 MG TABS tablet, Take 1 tablet by mouth daily., Disp: 90 tablet, Rfl: 3   lovastatin (MEVACOR) 40 MG tablet, Take 40 mg by mouth at bedtime., Disp: , Rfl:    Multiple Vitamin (MULTIVITAMIN) capsule, Take 1 capsule by mouth daily., Disp: , Rfl:    Omega-3 1000 MG CAPS, Take by mouth., Disp: , Rfl:    timolol (TIMOPTIC) 0.5 % ophthalmic solution, Place 1 drop into both eyes 2 (two) times daily., Disp: , Rfl:    traMADol (ULTRAM) 50 MG tablet, Take 1/2 to 1 po bid, Disp: 60 tablet, Rfl: 5   VITAMIN D PO, Take 1,000 Units by mouth daily., Disp: , Rfl:    ZINC OXIDE PO, Take by mouth daily., Disp: , Rfl:    buPROPion (WELLBUTRIN XL) 150 MG 24 hr tablet, Take 1 tablet (150 mg total) by mouth daily. (Patient not taking: Reported on  11/30/2022), Disp: 90 tablet, Rfl: 3   potassium chloride (KLOR-CON) 10 MEQ tablet, Take 20 mEq by mouth daily., Disp: , Rfl:   PAST MEDICAL HISTORY: Past Medical History:  Diagnosis Date   Chronic kidney disease    Diabetes mellitus without complication (HCC)    Endometrial cancer (HCC)    Glaucoma    Heart murmur    History of breast  cancer    Hypertension    TIA (transient ischemic attack)     PAST SURGICAL HISTORY: Past Surgical History:  Procedure Laterality Date   ABDOMINAL HYSTERECTOMY  1970's   CATARACT EXTRACTION  2008   LAPAROSCOPIC CHOLECYSTECTOMY  2001   MASTECTOMY  09/05/2006   left   SQUAMOUS CELL CARCINOMA EXCISION  2000   nose    FAMILY HISTORY: Family History  Problem Relation Age of Onset   Heart disease Mother    Stroke Father    Heart disease Sister    Diabetes Sister     SOCIAL HISTORY:  Social History   Socioeconomic History   Marital status: Married    Spouse name: Not on file   Number of children: 1   Years of education: Not on file   Highest education level: Not on file  Occupational History   Occupation: retired  Tobacco Use   Smoking status: Never   Smokeless tobacco: Never  Vaping Use   Vaping status: Never Used  Substance and Sexual Activity   Alcohol use: No   Drug use: No   Sexual activity: Not on file  Other Topics Concern   Not on file  Social History Narrative   Right Handed    2 Cups of Coffee per Day   Social Determinants of Health   Financial Resource Strain: Low Risk  (06/06/2021)   [facility] prior to 04/01/[facility] visits prior to 04/01/2022., [facility]   Overall Financial Resource Strain (CARDIA)    Difficulty of Paying Living Expenses: Not hard at all  Food Insecurity: Low Risk  (06/09/2022)   [facility]   Hunger Vital Sign    Worried About Running Out of Food in the Last Year: Never  true    Ran Out of Food in the Last Year: Never true  Transportation Needs: Not on file (06/09/2022)  Physical Activity: Unknown (06/06/2021)   [facility] visits prior to 04/01/2022., [facility] visits prior to 04/01/2022., [facility], [facility] visits prior to 04/01/2022., [facility]   Exercise Vital Sign    Days of Exercise per Week: 0 days    Minutes of Exercise per Session: Not on file  Stress: No Stress Concern Present (06/06/2021)   [facility] prior to 04/01/2022., [facility], [facility] visits prior to 04/01/2022., [facility]   [employer] of Occupational Health - Occupational Stress Questionnaire    Feeling of Stress : Not at all  Social Connections: Moderately Integrated (06/06/2021)   [facility] from [facility] visits prior to 04/01/2022., [facility], [facility] visits prior to 04/01/2022., [facility]   Social Connection and Isolation Panel [NHANES]    Frequency of Communication with Friends and Family: More than three times a week    Frequency of Social Gatherings with Friends and Family: More than three times a week    Attends Religious Services: More than 4 times per year    Active Member of [employer] or Organizations: Yes    Attends Banker Meetings: More than 4 times per year    Marital Status: Widowed  Intimate Partner Violence: Not At Risk (06/06/2021)   Received from [facility] visits prior to 04/01/2022., [facility] visits  prior to 04/01/2022.   Humiliation, Afraid, Rape, and Kick questionnaire    Fear of Current or Ex-Partner: No    Emotionally Abused: No    Physically Abused: No    Sexually Abused: No     PHYSICAL EXAM  Vitals:   11/30/22 1240  BP: 132/75  Pulse: 60  Weight: 137 lb 8 oz (62.4 kg)  Height: 5\' 2"   (1.575 m)    Body mass index is 25.15 kg/m.    General: The patient is well-developed and well-nourished and in no acute distress.  There is mild tenderness over the right subacromial bursa and mild to moderate tenderness over the glenohumeral joint.  Range of motion is reduced.    Neck/CV:   She has carotid bruits bilaterally, louder on the left.  The heart has a regular rate and rhythm with normal sounds  Neurologic Exam  Mental status: The patient is alert and oriented x 2 at the time of the examination. The patient has reduced orientation, short-term memory and focus. .  The Montreal cognitive assessment score was 17, very similar to the 16 she scored last year speech is normal.  Cranial nerves: Extraocular movements show reduced upgaze.  Facial strength and sensation was normal.  The trapezius strength was normal.  There is no dysarthria.  The tongue is midline, and the patient has symmetric elevation of the soft palate. No obvious hearing deficits are noted.  Motor:  Muscle bulk is normal.   Tone is normal. Strength is  5 / 5 in all 4 extremities.   Sensory: Sensory testing is intact to touch and vibration in the arms.  Coordination: Cerebellar testing reveals good finger-nose-finger and heel-to-shin bilaterally.  Gait and station: Station is normal.   The gait has a mildly reduced stride and can turn in 4 steps which is likely normal for age.    Tandem gait was not tested.  Romberg is negative.  Reflexes: Deep tendon reflexes are brisk at the knees with spread bilaterally.       DIAGNOSTIC DATA (LABS, IMAGING, TESTING) - I reviewed patient records, labs, notes, testing and imaging myself where available.  Lab Results  Component Value Date   WBC 5.0 01/31/2021   HGB 11.6 (L) 01/31/2021   HCT 33.9 (L) 01/31/2021   MCV 90.4 01/31/2021   PLT 270 01/31/2021      Component Value Date/Time   NA 139 01/31/2021 1141   K 4.0 01/31/2021 1141   CL 106 01/31/2021 1141   CO2 20  (L) 01/31/2021 1141   GLUCOSE 103 (H) 01/31/2021 1141   BUN 49 (H) 01/31/2021 1141   CREATININE 1.65 (H) 01/31/2021 1141   CALCIUM 9.3 01/31/2021 1141   PROT 7.4 01/06/2019 1450   ALBUMIN 4.4 01/06/2019 1450   AST 15 01/06/2019 1450   ALT 7 01/06/2019 1450   ALKPHOS 41 01/06/2019 1450   BILITOT 0.4 01/06/2019 1450   GFRNONAA 29 (L) 01/31/2021 1141   GFRAA (L) 09/03/2006 1110    52        The eGFR has been calculated using the MDRD equation. This calculation has not been validated in all clinical       ASSESSMENT AND PLAN    1. Alzheimer's disease (HCC)   2. TIA (transient ischemic attack)   3. Atrial fibrillation, unspecified type (HCC)   4. Bilateral carotid artery stenosis   5. Chronic right shoulder pain   6. Depression with anxiety   7. Chronic midline low back pain without  sciatica       1.   Due to the A-fib, the Plavix was changed to Eliquis..   continue Folbee 2.   Continue donepezil 5 mg.  She has been unable to tolerate 10 mg.  Continue Wellbutrin.  She is not a candidate for lecanemab.   3.    We had a conversation about Alzheimer's, prognosis.  She is currently living by herself and has a home health aide 3 days a week and her daughter stops by 2-3 days a week.  As long as this is working for everybody there is no need for different arrangement.  However it is probable that she will progress over the next couple years and it may be necessary for her to have a different living arrangement.   4.  She is not apparently a surgical candidate for her shoulder and back pain.  I will add a low-dose of tramadol but keep the dose and just 1 pill twice a day as she is also on Wellbutrin every 1 to keep the risk of seizures low 5.  Return in 12 months. She or her daughter are advised to call me if she has any new or worsening neurologic symptoms.  40-minute office visit with the majority of the time spent face-to-face for history and physical, discussion/counseling and  decision-making.  Additional time with record review and documentation.   This visit is part of a comprehensive longitudinal care medical relationship regarding the patients primary diagnosis of Alzheimer' and related concerns.   Cumi Sanagustin A. Epimenio Foot, MD, PhD 11/30/2022, 5:39 PM Certified in Neurology, Clinical Neurophysiology, Sleep Medicine, Pain Medicine and Neuroimaging  [facility] [address] [place], [place] [address] [phone]

## 2023-03-19 ENCOUNTER — Other Ambulatory Visit: Payer: Self-pay | Admitting: Neurology

## 2023-04-17 ENCOUNTER — Other Ambulatory Visit: Payer: Self-pay | Admitting: Neurology

## 2023-06-07 ENCOUNTER — Telehealth: Payer: Self-pay | Admitting: Neurology

## 2023-06-07 NOTE — Telephone Encounter (Signed)
 rs appointment

## 2023-06-11 ENCOUNTER — Encounter: Payer: Self-pay | Admitting: Neurology

## 2023-06-12 ENCOUNTER — Other Ambulatory Visit: Payer: Self-pay | Admitting: Neurology

## 2023-06-13 NOTE — Telephone Encounter (Signed)
 Last seen on 103/1/24 Follow up scheduled on 01/16/24  Per last note "She is not apparently a surgical candidate for her shoulder and back pain. I will add a low-dose of tramadol  but keep the dose and just 1 pill twice a day as she is also on Wellbutrin  every 1 to keep the risk of seizures low "  Did you want patient to continue?  Rx pending to be signed

## 2023-06-14 ENCOUNTER — Ambulatory Visit: Payer: Medicare Other | Admitting: Neurology

## 2024-01-16 ENCOUNTER — Ambulatory Visit: Admitting: Neurology

## 2024-03-02 DEATH — deceased
